# Patient Record
Sex: Male | Born: 1935 | Race: White | Hispanic: No | Marital: Married | Smoking: Former smoker
Health system: Southern US, Community
[De-identification: ages and names within clinical notes are randomized; demographics above are authoritative.]

## PROBLEM LIST (undated history)

## (undated) DIAGNOSIS — I69320 Aphasia following cerebral infarction: Secondary | ICD-10-CM

## (undated) DIAGNOSIS — I639 Cerebral infarction, unspecified: Secondary | ICD-10-CM

## (undated) DIAGNOSIS — G309 Alzheimer's disease, unspecified: Secondary | ICD-10-CM

## (undated) DIAGNOSIS — S43102A Unspecified dislocation of left acromioclavicular joint, initial encounter: Secondary | ICD-10-CM

## (undated) DIAGNOSIS — K4090 Unilateral inguinal hernia, without obstruction or gangrene, not specified as recurrent: Secondary | ICD-10-CM

## (undated) DIAGNOSIS — F320 Major depressive disorder, single episode, mild: Secondary | ICD-10-CM

## (undated) DIAGNOSIS — N529 Male erectile dysfunction, unspecified: Secondary | ICD-10-CM

## (undated) DIAGNOSIS — N401 Enlarged prostate with lower urinary tract symptoms: Secondary | ICD-10-CM

## (undated) DIAGNOSIS — G8191 Hemiplegia, unspecified affecting right dominant side: Secondary | ICD-10-CM

## (undated) DIAGNOSIS — I10 Essential (primary) hypertension: Secondary | ICD-10-CM

## (undated) DIAGNOSIS — F0280 Dementia in other diseases classified elsewhere without behavioral disturbance: Secondary | ICD-10-CM

## (undated) DIAGNOSIS — E785 Hyperlipidemia, unspecified: Secondary | ICD-10-CM

## (undated) DIAGNOSIS — M4800 Spinal stenosis, site unspecified: Secondary | ICD-10-CM

## (undated) DIAGNOSIS — N400 Enlarged prostate without lower urinary tract symptoms: Secondary | ICD-10-CM

## (undated) DIAGNOSIS — C189 Malignant neoplasm of colon, unspecified: Secondary | ICD-10-CM

## (undated) DIAGNOSIS — F32A Depression, unspecified: Secondary | ICD-10-CM

## (undated) HISTORY — DX: Benign prostatic hyperplasia with lower urinary tract symptoms: N40.1

## (undated) HISTORY — DX: Male erectile dysfunction, unspecified: N52.9

## (undated) HISTORY — DX: Depression, unspecified: F32.A

## (undated) HISTORY — DX: Major depressive disorder, single episode, mild: F32.0

## (undated) HISTORY — DX: Cerebral infarction, unspecified: I63.9

## (undated) HISTORY — PX: KIDNEY SURGERY: SHX687

## (undated) HISTORY — DX: Aphasia following cerebral infarction: I69.320

## (undated) HISTORY — PX: TONSILECTOMY, ADENOIDECTOMY, BILATERAL MYRINGOTOMY AND TUBES: SHX2538

## (undated) HISTORY — DX: Alzheimer's disease, unspecified: G30.9

## (undated) HISTORY — DX: Benign prostatic hyperplasia without lower urinary tract symptoms: N40.0

## (undated) HISTORY — DX: Dementia in other diseases classified elsewhere, unspecified severity, without behavioral disturbance, psychotic disturbance, mood disturbance, and anxiety: F02.80

## (undated) HISTORY — DX: Hemiplegia, unspecified affecting right dominant side: G81.91

## (undated) HISTORY — DX: Unilateral inguinal hernia, without obstruction or gangrene, not specified as recurrent: K40.90

## (undated) HISTORY — DX: Spinal stenosis, site unspecified: M48.00

## (undated) HISTORY — DX: Essential (primary) hypertension: I10

## (undated) HISTORY — DX: Unspecified dislocation of left acromioclavicular joint, initial encounter: S43.102A

## (undated) HISTORY — DX: Hyperlipidemia, unspecified: E78.5

---

## 1996-09-24 HISTORY — PX: HERNIA REPAIR: SHX51

## 1997-09-30 ENCOUNTER — Encounter: Admission: RE | Admit: 1997-09-30 | Discharge: 1997-12-29 | Payer: Self-pay | Admitting: *Deleted

## 1998-11-21 ENCOUNTER — Other Ambulatory Visit: Admission: RE | Admit: 1998-11-21 | Discharge: 1998-11-21 | Payer: Self-pay | Admitting: Urology

## 1999-07-08 ENCOUNTER — Encounter: Payer: Self-pay | Admitting: *Deleted

## 1999-07-08 ENCOUNTER — Encounter: Admission: RE | Admit: 1999-07-08 | Discharge: 1999-07-08 | Payer: Self-pay | Admitting: *Deleted

## 2000-03-09 ENCOUNTER — Ambulatory Visit (HOSPITAL_COMMUNITY): Admission: RE | Admit: 2000-03-09 | Discharge: 2000-03-09 | Payer: Self-pay | Admitting: Neurology

## 2000-03-27 ENCOUNTER — Emergency Department (HOSPITAL_COMMUNITY): Admission: EM | Admit: 2000-03-27 | Discharge: 2000-03-27 | Payer: Self-pay | Admitting: Emergency Medicine

## 2000-04-18 ENCOUNTER — Encounter: Admission: RE | Admit: 2000-04-18 | Discharge: 2000-04-18 | Payer: Self-pay | Admitting: *Deleted

## 2000-04-18 ENCOUNTER — Encounter: Payer: Self-pay | Admitting: *Deleted

## 2001-06-14 DIAGNOSIS — S43102A Unspecified dislocation of left acromioclavicular joint, initial encounter: Secondary | ICD-10-CM

## 2001-06-14 HISTORY — DX: Unspecified dislocation of left acromioclavicular joint, initial encounter: S43.102A

## 2001-06-14 HISTORY — PX: ORIF ACROMIOCLAVICULAR JOINT: SUR916

## 2002-06-09 ENCOUNTER — Encounter: Payer: Self-pay | Admitting: Orthopaedic Surgery

## 2002-06-09 ENCOUNTER — Emergency Department (HOSPITAL_COMMUNITY): Admission: EM | Admit: 2002-06-09 | Discharge: 2002-06-09 | Payer: Self-pay | Admitting: Emergency Medicine

## 2004-02-27 ENCOUNTER — Encounter: Admission: RE | Admit: 2004-02-27 | Discharge: 2004-02-27 | Payer: Self-pay | Admitting: Orthopaedic Surgery

## 2004-02-28 ENCOUNTER — Ambulatory Visit (HOSPITAL_COMMUNITY): Admission: RE | Admit: 2004-02-28 | Discharge: 2004-02-28 | Payer: Self-pay | Admitting: Orthopaedic Surgery

## 2004-02-28 ENCOUNTER — Ambulatory Visit (HOSPITAL_BASED_OUTPATIENT_CLINIC_OR_DEPARTMENT_OTHER): Admission: RE | Admit: 2004-02-28 | Discharge: 2004-02-28 | Payer: Self-pay | Admitting: Orthopaedic Surgery

## 2006-05-28 ENCOUNTER — Emergency Department (HOSPITAL_COMMUNITY): Admission: EM | Admit: 2006-05-28 | Discharge: 2006-05-28 | Payer: Self-pay | Admitting: Emergency Medicine

## 2006-05-31 ENCOUNTER — Encounter: Admission: RE | Admit: 2006-05-31 | Discharge: 2006-05-31 | Payer: Self-pay | Admitting: Orthopedic Surgery

## 2006-10-24 ENCOUNTER — Emergency Department (HOSPITAL_COMMUNITY): Admission: EM | Admit: 2006-10-24 | Discharge: 2006-10-24 | Payer: Self-pay | Admitting: Family Medicine

## 2008-06-14 DIAGNOSIS — I639 Cerebral infarction, unspecified: Secondary | ICD-10-CM

## 2008-06-14 HISTORY — DX: Cerebral infarction, unspecified: I63.9

## 2010-10-30 NOTE — Consult Note (Signed)
[place]  Patient:    Trevor Lutz, Trevor Lutz                       MRN: [id number] Proc. Date: 03/27/00 Adm. Date:  [id number] Attending:  Lorre Nick CC:         Vania Rea. Jarold Motto, M.D. [facility]  Genene Churn. Love, M.D.   Consultation Report  DATE OF BIRTH:  10-31-1921  REASON FOR CONSULTATION:  Painless rectal bleeding.  ASSESSMENT: 1. Rectal bleeding with history of external hemorrhoids. 2. History of hypertension, on Norvasc. 3. History of benign prostatic hypertrophy, on Flomax. 4. Memory loss recently, started on Reminyl. 5. Hypercholesterolemia, on Zocor. 6. 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 7. Recent colonoscopy that was essentially unrevealing (as per Dr. Sheryn Bison).  RECOMMENDATIONS: 1. Patient has been advised to stay in the hospital for 24 hour observation,    but he decided against it - he wanted to go home and to be seen by    Dr. Sheryn Bison in his office tomorrow morning.  Considering his    decision, the patient has been advised to call me or to return to the    emergency room if his symptoms worsen or if he has ongoing rectal    bleeding, dizziness, weakness, etc. 2. Discontinue aspirin and all other nonsteroidals for now. 3. Serial CBCs and further workup by Dr. Jarold Motto as advised.  DISCUSSION:  [name] is a 77 year old white male who was in his usual state of health until this morning, when he had four bowel movements with some bleeding.  Subsequently, the patient went to church and noted blood oozing through his clothes.  This prompted a visit to the emergency room, where he was seen and evaluated.  Labs were done, along with orthostatic vital signs, and GI consultation was procured.  Patient denied any history of dizziness, weakness, nausea, vomiting, fever, chills, rigors, abdominal pain, or melanic stools.  He denied straining or recent problem with constipation.  His  weight has been stable.  There is no history of chest pain, shortness of breath, or genitourinary complaints.  He denies any rectal burning or pain.  There is no history of nonsteroidal use besides the regular aspirin he takes.  While the patient was in the emergency room, I also spoke to Dr. Avie Echevaria, who suggests that his problems may have been set off by Reminyl the patient has been started on recently for memory loss.  This medication is known to cause diarrhea, which may have stimulated his rectal bleeding.  ALLERGIES:  No known drug allergies.  MEDICATIONS:  Zocor, Norvasc, aspirin, Flomax.  SOCIAL HISTORY:  The patient is a retired Pensions consultant, lives in [place] with his wife.  He drinks alcohol socially, but denies use of tobacco or street drugs.  FAMILY HISTORY:  Noncontributory.  GENERAL PHYSICAL EXAMINATION:  GENERAL:  Reveals an elderly white male lying on the ER stretcher in no acute distress.  VITAL SIGNS:  Blood pressure 120/80, pulse 54 per minute, respiratory 20 per minute, temperature 97 degrees Fahrenheit.  HEENT:  ______ PERRLA, face is symmetric.  NECK:  Supple, no JVD is heard.  No lymphadenopathy.  CHEST:  Clear to auscultation.  S1, regular, no murmur, rub, or gallop, rales, rhonchi, or wheezing.  ABDOMEN:  Soft, nondistended, with surgical scars present right of the midline from a previous nephrectomy.  No hepatosplenomegaly appreciated.  No  other masses palpable except for a small nodule in the left lower quadrant, which the patient says has been diagnosed as a lipoma in the past.  RECTAL:  Real small external hemorrhoids with fresh blood around them.  There is no evidence of thrombosis of the hemorrhoids.  A rectal examination reveals a significantly enlarged prostate with no definite nodularity appreciated. No other masses palpable.  There is fresh blood on examining finger.  No other masses palpable.  LABORATORY EVALUATION IN THE EMERGENCY  ROOM:  Revealed a hemoglobin of 14.5 gm/dcl, white count 5.5 , MCV 91.5, platelet count 136, with a normal differential.  Sodium 137, potassium 4.3, chloride 106, CO2 25, glucose 96, BUN 20, creatinine 1.1, calcium 8.9, total protein 6.1, albumin 3.5, AST 24, ALT 21, alkaline phosphatase 42, and total bili 0.6.  DISPOSITION:  Considering this data, the patient has been advised to follow up with Dr. Jarold Motto in the morning.  If he has recurrence of his symptoms, he is to return to the ER immediately.  Patients nurse, Trevor Lutz ______ , was at his bedside when these instructions were given to him, and has reemphasized these instructions herself. DD:  03/27/00 TD:  03/27/00 Job: [id number]

## 2010-10-30 NOTE — Op Note (Signed)
NAME:  Trevor Lutz, WEXLER                          ACCOUNT NO.:  [id number]   MEDICAL RECORD NO.:  [id number]                   PATIENT TYPE:  AMB   LOCATION:  DSC                                  FACILITY:  [facility]   PHYSICIAN:  Claude Manges. Cleophas Dunker, M.D.            DATE OF BIRTH:  11/30/1921   DATE OF PROCEDURE:  02/28/2004  DATE OF DISCHARGE:                                 OPERATIVE REPORT   PREOPERATIVE DIAGNOSIS:  Displaced right olecranon fracture.   POSTOPERATIVE DIAGNOSIS:  Displaced right olecranon fracture .   PROCEDURE:  Open reduction internal fixation.   SURGEON:  Claude Manges. Cleophas Dunker, M.D.   ASSISTANT:  Richardean Canal, P.A.   ANESTHESIA:  General LMA.   COMPLICATIONS:  None.   HISTORY:  An 77 year old gentleman who sustained a fall several days ago,  with persistent swelling and black and blue discoloration of the forearm,  associated with local tenderness about the elbow.  He was seen in the office  two days ago, with evidence of a olecranon fracture extending into the  joint, with about an eighth of an inch displacement.  He is now to have open  reduction and internal fixation.   DESCRIPTION OF PROCEDURE:  With the patient comfortable on the operating  room table, and under general anesthesia with the LMA, proximal tourniquet  was applied to the right upper extremity.  The right arm was then prepped  with Duraprep from the tips of the fingers to the tourniquet.  Sterile  draping was performed.  With the extremity still elevated, it was Esmarched  exsanguinated with a proximal tourniquet at 250 mmHg.   A longitudinal incision was made over the olecranon, extending from the tip  of the olecranon about 2-3 inches distally.  By sharp dissection, incision  went on through subcutaneous tissue, and then by further sharp dissection  the periosteum was then incised and then elevated radially and ulnarly.  The  fracture was identified just over an inch from the tip of the  olecranon.  It  extended obliquely from distal to proximal into the joint.  I could  visualize the fracture; it was reduced and maintained with a bone clamp, and  a K-wire placed across the fracture site.   I elected to use the AccuMed plate and screws, as it provides a low profile  construct with excellent fixation.  The titanium plate was then placed over  the tip of the olecranon and directly over the crest of the ulna distally.  Two screw holes were drilled, measured and filled with self-tapping cortical  screws distally -- with excellent purchase with both screws.  A third screw  was then placed just proximal to the fracture site, and in similar fashion a  drill measured and filled with the self-tapping cortical screw.  That screw  did cross the fracture site obliquely.  Image intensification revealed  excellent position of the fracture at  the joint space, and significant  decrease in the fracture itself.  Two further screws were inserted; one was  placed with a cancellous screw for the very tip of the plate at the  olecranon, and the other just distal to it a self-tapping cortical screw  into the proximal olecranon.   I had a very stable construct.  There was absolutely no motion of the  fracture site.  I did use small bone from the drill holes to place over the  fracture site.  As I placed the arm through a full range of motion there was  no evidence of impingement nor was there any motion.  The wound was  irrigated with saline solution.  The fascia was then sutured anatomically  over the plate with 2-0 and 3-0 Vicryl, with skin closed with skin clips.  Then 0.25% Marcaine without epinephrine was injected into the wound edges.  Sterile bulk dressing was applied, followed by posterior splint and a sling.   The patient tolerated the procedure without complications.   PLAN:  Recovery care.  Discharged on Vicodin.  To follow up at office in 5-7  days.      PWW/MEDQ  D:   02/28/2004  T:  02/29/2004  Job:  [id number]

## 2010-11-13 ENCOUNTER — Other Ambulatory Visit: Payer: Self-pay | Admitting: Neurology

## 2010-11-13 DIAGNOSIS — R413 Other amnesia: Secondary | ICD-10-CM

## 2010-11-17 ENCOUNTER — Ambulatory Visit
Admission: RE | Admit: 2010-11-17 | Discharge: 2010-11-17 | Disposition: A | Discharge status: Home / Self Care | Payer: Medicare PPO | Source: Ambulatory Visit | Attending: Neurology | Admitting: Neurology

## 2010-11-17 DIAGNOSIS — R413 Other amnesia: Secondary | ICD-10-CM

## 2012-09-26 DIAGNOSIS — K4090 Unilateral inguinal hernia, without obstruction or gangrene, not specified as recurrent: Secondary | ICD-10-CM | POA: Insufficient documentation

## 2012-09-26 HISTORY — DX: Unilateral inguinal hernia, without obstruction or gangrene, not specified as recurrent: K40.90

## 2012-10-03 ENCOUNTER — Encounter (INDEPENDENT_AMBULATORY_CARE_PROVIDER_SITE_OTHER): Payer: Self-pay | Admitting: General Surgery

## 2012-10-06 ENCOUNTER — Encounter (INDEPENDENT_AMBULATORY_CARE_PROVIDER_SITE_OTHER): Payer: Self-pay | Admitting: Surgery

## 2012-10-06 ENCOUNTER — Ambulatory Visit (INDEPENDENT_AMBULATORY_CARE_PROVIDER_SITE_OTHER): Payer: Medicare PPO | Admitting: Surgery

## 2012-10-06 VITALS — BP 114/78 | HR 60 | Temp 97.4°F | Resp 16 | Ht 68.5 in | Wt 160.8 lb

## 2012-10-06 DIAGNOSIS — K4090 Unilateral inguinal hernia, without obstruction or gangrene, not specified as recurrent: Secondary | ICD-10-CM

## 2012-10-06 NOTE — Patient Instructions (Signed)
Before we make a decision about repairing your hernia I will contact Dr Valentina Lucks.

## 2012-10-06 NOTE — Progress Notes (Signed)
Trevor Lutz DOB: 05/08/1922 MRN: [id number]                                                                                      DATE: 10/06/2012  PCP: Lillia Mountain, MD Referring Provider: No ref. provider found  IMPRESSION:  Reducible RIH, asymptomatic and likely indirect  PLAN:   I have discussed with the patient the fact that he has an inguinal hernia and reviewed the pathophysiology of that diagnosis. I have given him educational materials about this. I discussed options for treatment including observation or repair and have discussed both laparoscopic and open inguinal hernia repairs as potential techniques. We have discussed the use of mesh. We have discussed risks of surgery including bleeding, infection, recurrence, postoperative pain and possible chronic groin pain, testicular injury, and potential issues with voiding. I believe the patient's questions have been answered and that he has a good understanding of the issues  He is active given his age and would likely tolerate surgery well. The hernia is easily reducible and asx so a case could be made to defer repair. I discussed all this with him and will contact his PCP next week before making a decison                 CC:  Chief Complaint  Patient presents with  . Inguinal Hernia    new pt- eval RIH    HPI:  Trevor Lutz is a 79 y.o.  male who presents for evaluation of Right inguinal hernial he was referred to [place] by Dr. Clinton Sawyer for evaluation of a right inguinal hernia.although the patient doesn't recall, he had a large left indirect hernia repaired with mesh by Dr. Ovidio Kin in 1998. He has been aware of current hernia for about the last 8 weeks. It is asymptomatic and always reduce when he lies down. It has not gotten larger since he first noticed it.he plays tennis about 3 times per week. He is not having any other real symptoms.  PMH:  has a past medical history of Hypertension; BPH (benign prostatic  hyperplasia); Alzheimer disease; Hyperlipidemia; Erectile dysfunction; Mild depression; Spinal stenosis; and CVA (cerebral vascular accident).  PSH:   has past surgical history that includes Kidney surgery; Tonsilectomy, adenoidectomy, bilateral myringotomy and tubes; and Hernia repair (Left, 09/24/1996).  ALLERGIES:  No Known Allergies  MEDICATIONS: Current outpatient prescriptions:amLODipine (NORVASC) 5 MG tablet, Take 5 mg by mouth daily., Disp: , Rfl: ;  Ascorbic Acid (VITAMIN C PO), Take by mouth., Disp: , Rfl: ;  aspirin 325 MG tablet, Take 325 mg by mouth daily., Disp: , Rfl: ;  Cholecalciferol (VITAMIN D PO), Take 1,000 Units by mouth daily., Disp: , Rfl: ;  citalopram (CELEXA) 10 MG tablet, Take 10 mg by mouth daily., Disp: , Rfl:  divalproex (DEPAKOTE) 250 MG DR tablet, Take 250 mg by mouth 3 (three) times daily., Disp: , Rfl: ;  galantamine (RAZADYNE) 12 MG tablet, Take 12 mg by mouth 2 (two) times daily., Disp: , Rfl: ;  l-methylfolate-b2-b6-b12 (CEREFOLIN) 11-12-48-5 MG TABS, Take 1 tablet by mouth daily., Disp: , Rfl: ;  memantine (NAMENDA) 10  MG tablet, Take 10 mg by mouth 2 (two) times daily., Disp: , Rfl:  Multiple Vitamin (MULTIVITAMIN) tablet, Take 1 tablet by mouth daily., Disp: , Rfl: ;  simvastatin (ZOCOR) 40 MG tablet, Take 40 mg by mouth every evening., Disp: , Rfl: ;  tamsulosin (FLOMAX) 0.4 MG CAPS, Take by mouth., Disp: , Rfl: ;  vitamin B-12 (CYANOCOBALAMIN) 100 MCG tablet, Take 50 mcg by mouth daily., Disp: , Rfl:   ROS: He has filled out our 12 point review of systems and it is negative . EXAM:   VITAL SIGNS: BP 114/78  Pulse 60  Temp(Src) 97.4 F (36.3 C) (Oral)  Resp 16  Ht 5' 8.5" (1.74 m)  Wt 160 lb 12.8 oz (72.938 kg)  BMI 24.09 kg/m2  GENERAL:  The patient is alert, oriented, and generally healthy-appearing, NAD. Mood and affect are normal.  HEENT:  The head is normocephalic, the eyes nonicteric, the pupils were round regular and equal. EOMs are normal.  Pharynx normal. Dentition good.  NECK:  The neck is supple and there are no masses or thyromegaly.  LUNGS:  Normal respirations and clear to auscultation.  HEART:  Regular rhythm, with no murmurs rubs or gallops. Pulses are intact carotid dorsalis pedis and posterior tibial. No significant varicosities are noted.  ABDOMEN:  Soft, flat, and nontender. No masses or organomegaly is noted. No hernias are noted. Bowel sounds are normal.  GU:  Testes are normal bilaterally. There is no recurrent hernia present and the repair appeared solid. There is an obvious bulge in the right inguinal canal consistent with an indirect hernia. It reduces easily.  EXTREMITIES:  Good range of motion, no edema.   DATA REVIEWED:  I have reviewed the notes from the primary care physician's office as well as our old medical records.    Ayman Brull J 10/06/2012  CC: No ref. provider found, Lillia Mountain, MD

## 2012-10-10 ENCOUNTER — Telehealth (INDEPENDENT_AMBULATORY_CARE_PROVIDER_SITE_OTHER): Payer: Self-pay | Admitting: Surgery

## 2012-10-10 NOTE — Telephone Encounter (Signed)
I spoke with Dr Valentina Lucks who told me he thinks Trevor Lutz is medically stable to have surgery to repair his hernia. The patient does have some dementia and the hernia is not bothering  Him, so Dr Valentina Lucks felt we could either repair it or leave it for the time being. I spoke with Trevor Lutz, who told me he will discuss with his wife, who is out of town. We will try to contact them again in a few days to be sure the message gets through to his wife.

## 2012-11-03 ENCOUNTER — Emergency Department (HOSPITAL_COMMUNITY)
Admission: EM | Admit: 2012-11-03 | Discharge: 2012-11-03 | Disposition: A | Discharge status: Home / Self Care | Payer: Medicare PPO | Attending: Emergency Medicine | Admitting: Emergency Medicine

## 2012-11-03 ENCOUNTER — Encounter (HOSPITAL_COMMUNITY): Payer: Self-pay | Admitting: Emergency Medicine

## 2012-11-03 DIAGNOSIS — N529 Male erectile dysfunction, unspecified: Secondary | ICD-10-CM | POA: Insufficient documentation

## 2012-11-03 DIAGNOSIS — Z8673 Personal history of transient ischemic attack (TIA), and cerebral infarction without residual deficits: Secondary | ICD-10-CM | POA: Insufficient documentation

## 2012-11-03 DIAGNOSIS — I10 Essential (primary) hypertension: Secondary | ICD-10-CM | POA: Insufficient documentation

## 2012-11-03 DIAGNOSIS — Z79899 Other long term (current) drug therapy: Secondary | ICD-10-CM | POA: Insufficient documentation

## 2012-11-03 DIAGNOSIS — F3289 Other specified depressive episodes: Secondary | ICD-10-CM | POA: Insufficient documentation

## 2012-11-03 DIAGNOSIS — M4800 Spinal stenosis, site unspecified: Secondary | ICD-10-CM | POA: Insufficient documentation

## 2012-11-03 DIAGNOSIS — Z7982 Long term (current) use of aspirin: Secondary | ICD-10-CM | POA: Insufficient documentation

## 2012-11-03 DIAGNOSIS — G309 Alzheimer's disease, unspecified: Secondary | ICD-10-CM | POA: Insufficient documentation

## 2012-11-03 DIAGNOSIS — N400 Enlarged prostate without lower urinary tract symptoms: Secondary | ICD-10-CM | POA: Insufficient documentation

## 2012-11-03 DIAGNOSIS — K4090 Unilateral inguinal hernia, without obstruction or gangrene, not specified as recurrent: Secondary | ICD-10-CM | POA: Insufficient documentation

## 2012-11-03 DIAGNOSIS — E785 Hyperlipidemia, unspecified: Secondary | ICD-10-CM | POA: Insufficient documentation

## 2012-11-03 DIAGNOSIS — R339 Retention of urine, unspecified: Secondary | ICD-10-CM | POA: Insufficient documentation

## 2012-11-03 DIAGNOSIS — Z905 Acquired absence of kidney: Secondary | ICD-10-CM | POA: Insufficient documentation

## 2012-11-03 DIAGNOSIS — K645 Perianal venous thrombosis: Secondary | ICD-10-CM | POA: Insufficient documentation

## 2012-11-03 DIAGNOSIS — Z87891 Personal history of nicotine dependence: Secondary | ICD-10-CM | POA: Insufficient documentation

## 2012-11-03 DIAGNOSIS — F329 Major depressive disorder, single episode, unspecified: Secondary | ICD-10-CM | POA: Insufficient documentation

## 2012-11-03 DIAGNOSIS — F0280 Dementia in other diseases classified elsewhere without behavioral disturbance: Secondary | ICD-10-CM | POA: Insufficient documentation

## 2012-11-03 LAB — URINE MICROSCOPIC-ADD ON

## 2012-11-03 LAB — URINALYSIS, ROUTINE W REFLEX MICROSCOPIC
Leukocytes, UA: NEGATIVE
Nitrite: NEGATIVE
Specific Gravity, Urine: 1.022 (ref 1.005–1.030)
pH: 6 (ref 5.0–8.0)

## 2012-11-03 LAB — POCT I-STAT, CHEM 8
BUN: 21 mg/dL (ref 6–23)
Chloride: 107 mEq/L (ref 96–112)
Creatinine, Ser: 1.1 mg/dL (ref 0.50–1.35)
Glucose, Bld: 102 mg/dL — ABNORMAL HIGH (ref 70–99)
Potassium: 4.9 mEq/L (ref 3.5–5.1)

## 2012-11-03 NOTE — ED Notes (Signed)
PT ambulated in the hall with no ambulation assistance and no issues. Pt had steady gait.

## 2012-11-03 NOTE — ED Provider Notes (Signed)
History     CSN: [id number]  Arrival date & time 11/03/12  0544   First MD Initiated Contact with Patient 11/03/12 [phone]      Chief Complaint  Patient presents with  . Urinary Retention    (Consider location/radiation/quality/duration/timing/severity/associated sxs/prior treatment) HPI Comments: Patient presents with suprapubic pressure and inability to urinate since last night.  He has BPH and had a foley removed yesterday in Dr. Estil Daft office.  The foley was placed on 5/19 in [place].  Patient was seen in the ED for rectal bleeding and had 2 thrombosed hemorroids incised.  Denies any previous problems with urinary retention.  No fever, chills, nausea, vomiting, chest pain, SOB, back pain, abdominal pain.  No weaknes, numbness, tingling.  Foley placed before my evaluation with 800 cc of clear urine.  States rectal bleeding has improved.  States he feels at his baseline which caregivers confirm.  The history is provided by the patient, a relative and a caregiver. The history is limited by the condition of the patient.    Past Medical History  Diagnosis Date  . Hypertension   . BPH (benign prostatic hyperplasia)   . Alzheimer disease   . Hyperlipidemia   . Erectile dysfunction   . Mild depression   . Spinal stenosis   . CVA (cerebral vascular accident)     Past Surgical History  Procedure Laterality Date  . Kidney surgery      right kidney removed  . Tonsilectomy, adenoidectomy, bilateral myringotomy and tubes    . Hernia repair Left 09/24/1996    Indirect, Dr Ezzard Standing surgeon    Family History  Problem Relation Age of Onset  . Heart disease Father     History  Substance Use Topics  . Smoking status: Former Games developer  . Smokeless tobacco: Not on file  . Alcohol Use: Yes      Review of Systems  Constitutional: Negative for fever, activity change and appetite change.  HENT: Negative for congestion and rhinorrhea.   Respiratory: Negative for cough, chest  tightness and shortness of breath.   Cardiovascular: Negative for chest pain.  Gastrointestinal: Negative for nausea, vomiting and abdominal pain.  Genitourinary: Positive for dysuria and difficulty urinating.  Musculoskeletal: Negative for back pain.  Skin: Negative for rash.  Neurological: Negative for dizziness, weakness and headaches.  A complete 10 system review of systems was obtained and all systems are negative except as noted in the HPI and PMH.    Allergies  Review of patient's allergies indicates no known allergies.  Home Medications   Current Outpatient Rx  Name  Route  Sig  Dispense  Refill  . amLODipine (NORVASC) 5 MG tablet   Oral   Take 5 mg by mouth daily.         . Ascorbic Acid (VITAMIN C PO)   Oral   Take 1 tablet by mouth daily.          Marland Kitchen aspirin 325 MG tablet   Oral   Take 325 mg by mouth daily.         . Cholecalciferol (VITAMIN D PO)   Oral   Take 1,000 Units by mouth daily.         . citalopram (CELEXA) 10 MG tablet   Oral   Take 10 mg by mouth daily.         . divalproex (DEPAKOTE) 250 MG DR tablet   Oral   Take 250 mg by mouth 3 (three) times daily.         Marland Kitchen  galantamine (RAZADYNE) 12 MG tablet   Oral   Take 12 mg by mouth 2 (two) times daily.         Marland Kitchen l-methylfolate-b2-b6-b12 (CEREFOLIN) 11-12-48-5 MG TABS   Oral   Take 1 tablet by mouth daily.         . memantine (NAMENDA) 10 MG tablet   Oral   Take 10 mg by mouth 2 (two) times daily.         . Multiple Vitamin (MULTIVITAMIN) tablet   Oral   Take 1 tablet by mouth daily.         . simvastatin (ZOCOR) 40 MG tablet   Oral   Take 40 mg by mouth every evening.         . tamsulosin (FLOMAX) 0.4 MG CAPS   Oral   Take 0.4 mg by mouth 2 (two) times daily.          . vitamin B-12 (CYANOCOBALAMIN) 100 MCG tablet   Oral   Take 100 mcg by mouth daily.            BP 147/78  Pulse 69  Temp(Src) 97.5 F (36.4 C) (Oral)  Resp 18  SpO2 100%  Physical  Exam  Constitutional: He is oriented to person, place, and time. He appears well-developed and well-nourished. No distress.  HENT:  Head: Normocephalic and atraumatic.  Mouth/Throat: Oropharynx is clear and moist. No oropharyngeal exudate.  Eyes: Conjunctivae and EOM are normal. Pupils are equal, round, and reactive to light.  Neck: Normal range of motion. Neck supple.  Cardiovascular: Normal rate, regular rhythm and normal heart sounds.   No murmur heard. Pulmonary/Chest: Effort normal and breath sounds normal. No respiratory distress.  Abdominal: Soft. There is no tenderness. There is no rebound and no guarding.  Genitourinary:  Multiple small external hemorroids, non thrombosed Reducible R inguinal hernia  Musculoskeletal: Normal range of motion. He exhibits no edema and no tenderness.  Neurological: He is alert and oriented to person, place, and time. No cranial nerve deficit. He exhibits normal muscle tone. Coordination normal.  5/5 strength in bilateral lower extremities. Ankle plantar and dorsiflexion intact. Great toe extension intact bilaterally. +2 DP and PT pulses. +2 patellar reflexes bilaterally. Normal gait.     ED Course  Procedures (including critical care time)  Labs Reviewed  URINALYSIS, ROUTINE W REFLEX MICROSCOPIC - Abnormal; Notable for the following:    Hgb urine dipstick TRACE (*)    All other components within normal limits  POCT I-STAT, CHEM 8 - Abnormal; Notable for the following:    Glucose, Bld 102 (*)    Hemoglobin 12.9 (*)    HCT 38.0 (*)    All other components within normal limits  URINE MICROSCOPIC-ADD ON   No results found.   1. Urinary retention       MDM  Unable to urinate since last night.  Hx BPH, foley removed yesterday.  Feels better after Foley inserted. No abdominal pain, back pain, focal weakness, numbness, tingling.  Family states has "one kidney".  Foley placed by nursing staff with 800 cc of clear urine. Creatinine  normal. Abdomen soft and nontender. Neuro intact. Ambulatory.  Stable for followup with urology. He is already on flomax.       Glynn Octave, MD 11/03/12 [phone]

## 2012-11-03 NOTE — ED Notes (Signed)
Pt had two hemmroids lanced earlier this week.  Pt was having difficulty urinating so a foley was put into the pt at that time.  Pt had foley removed yesterday and at present is unable to void.

## 2012-11-09 ENCOUNTER — Other Ambulatory Visit: Payer: Self-pay | Admitting: Urology

## 2012-11-10 ENCOUNTER — Ambulatory Visit (HOSPITAL_COMMUNITY)
Admission: RE | Admit: 2012-11-10 | Discharge: 2012-11-10 | Disposition: A | Discharge status: Home / Self Care | Payer: Medicare PPO | Source: Ambulatory Visit | Attending: Urology | Admitting: Urology

## 2012-11-10 ENCOUNTER — Encounter (HOSPITAL_COMMUNITY): Payer: Self-pay

## 2012-11-10 ENCOUNTER — Encounter (HOSPITAL_COMMUNITY): Payer: Self-pay | Admitting: Pharmacy Technician

## 2012-11-10 ENCOUNTER — Encounter (HOSPITAL_COMMUNITY)
Admission: RE | Admit: 2012-11-10 | Discharge: 2012-11-10 | Disposition: A | Discharge status: Home / Self Care | Payer: Medicare PPO | Source: Ambulatory Visit | Attending: Urology | Admitting: Urology

## 2012-11-10 DIAGNOSIS — I10 Essential (primary) hypertension: Secondary | ICD-10-CM | POA: Insufficient documentation

## 2012-11-10 DIAGNOSIS — N400 Enlarged prostate without lower urinary tract symptoms: Secondary | ICD-10-CM | POA: Insufficient documentation

## 2012-11-10 DIAGNOSIS — Z01810 Encounter for preprocedural cardiovascular examination: Secondary | ICD-10-CM | POA: Insufficient documentation

## 2012-11-10 DIAGNOSIS — Z01818 Encounter for other preprocedural examination: Secondary | ICD-10-CM | POA: Insufficient documentation

## 2012-11-10 DIAGNOSIS — Z01812 Encounter for preprocedural laboratory examination: Secondary | ICD-10-CM | POA: Insufficient documentation

## 2012-11-10 LAB — SURGICAL PCR SCREEN
MRSA, PCR: NEGATIVE
Staphylococcus aureus: NEGATIVE

## 2012-11-10 LAB — CBC
HCT: 45.8 % (ref 39.0–52.0)
MCV: 93.5 fL (ref 78.0–100.0)
RBC: 4.9 MIL/uL (ref 4.22–5.81)
WBC: 6.7 10*3/uL (ref 4.0–10.5)

## 2012-11-10 LAB — BASIC METABOLIC PANEL
BUN: 15 mg/dL (ref 6–23)
CO2: 30 mEq/L (ref 19–32)
Chloride: 103 mEq/L (ref 96–112)
Creatinine, Ser: 1.1 mg/dL (ref 0.50–1.35)

## 2012-11-10 NOTE — Patient Instructions (Addendum)
YOUR SURGERY IS SCHEDULED AT [facility]  ON:  Monday  6/2  REPORT TO [facility] SHORT STAY [facility] AT:  5:30 AM      PHONE # FOR SHORT STAY IS [phone]  IF YOU HAVE A HEALTH CARE POWER OF ATTORNEY AND / OR LIVING WILL - PLEASE BRING DAY OF SURGERY.  STOP TAKING DAILY ASPIRIN FOR SURGERY.  DO NOT EAT OR DRINK ANYTHING AFTER MIDNIGHT THE NIGHT BEFORE YOUR SURGERY.  YOU MAY BRUSH YOUR TEETH, RINSE OUT YOUR MOUTH--BUT NO WATER, NO FOOD, NO CHEWING GUM, NO MINTS, NO CANDIES, NO CHEWING TOBACCO.  PLEASE TAKE THE FOLLOWING MEDICATIONS THE AM OF YOUR SURGERY WITH A FEW SIPS OF WATER: AMLODIPINE, CITALOPRAM, DIVALPROEX, GALANTAMINE, NAMENDA, TAMSULOSIN       DO NOT BRING VALUABLES, MONEY, CREDIT CARDS.  DO NOT WEAR JEWELRY, MAKE-UP, NAIL POLISH AND NO METAL PINS OR CLIPS IN YOUR HAIR. CONTACT LENS, DENTURES / PARTIALS, GLASSES SHOULD NOT BE WORN TO SURGERY AND IN MOST CASES-HEARING AIDS WILL NEED TO BE REMOVED.  BRING YOUR GLASSES CASE, ANY EQUIPMENT NEEDED FOR YOUR CONTACT LENS. FOR PATIENTS ADMITTED TO THE HOSPITAL--CHECK OUT TIME THE DAY OF DISCHARGE IS 11:00 AM.  ALL INPATIENT ROOMS ARE PRIVATE - WITH BATHROOM, TELEPHONE, TELEVISION AND WIFI INTERNET.                               PLEASE READ OVER ANY  FACT SHEETS THAT YOU WERE GIVEN: MRSA INFORMATION, BLOOD TRANSFUSION INFORMATION, INCENTIVE SPIROMETER INFORMATION. FAILURE TO FOLLOW THESE INSTRUCTIONS MAY RESULT IN THE CANCELLATION OF YOUR SURGERY.   PATIENT SIGNATURE_________________________________

## 2012-11-10 NOTE — Pre-Procedure Instructions (Addendum)
PT VOICED UNDERSTANDING THAT HE WAS HAVING PROSTATE SURGERY AND SIGNED OR CONSENT.  PT THEN LATER ASKED WHAT HIS SURGERY WAS -STATES HE DOESN'T REMEMBER.  PT HAS AN OFFICE NOTE ON HIS CHART FROM DR. LOVE-NEUROLOGIST - DATED 02/02/12 DETAILING PT'S MEDICAL HX AND MEMORY PROBLEMS.  PT CONTINUED THRU OUT PREOP VISIT TO ASK ABOUT HIS SURGERY.  HIS FRIEND BARRY MAYBERRY WITH HIM FOR PREOP VISIT AND INSTRUCTIONS AND STATES HE WILL MAKE SURE PT'S WIFE REVIEWS ALL PREOP INSTRUCTIONS.  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  I HAVE REQUESTED PT TO BRING - IF HE HAS.  I HAVE PLACED A NOTE ON PT'S CHART FOR SHORT STAY NURSE DAY OF SURGERY TO PLEASE HAVE PT'S WIFE SIGN THE CONSENT FOR SURGERY - SHE WILL BE WITH HIM DAY OF SURGERY.  I VOIDED THE CONSENT THE PT SIGNED. EKG AND CXR WERE DONE TODAY PREOP - AT [facility]. SPOKE WITH PAM GIBSON AT DR. Estil Daft OFFICE - SHE STATES PT WAS TO STOP ASPIRIN FOR SURGERY - I HAVE PUT ON HIS WRITTEN PREOP INSTRUCTIONS NOT TO TAKE ANY MORE ASPIRIN BEFORE SURGERY.

## 2012-11-11 NOTE — H&P (Signed)
History of Present Illness                          F/u BPH and urinary symptoms. Pt has been on tamsulosin for many years. H/o elevated PSA as high as 6.01 in 2006 but down to 4.7 in 2009.  -May 2014 - normal DRE - he wasn't certain he was taking tamsulosin.    Interval Hx He had some issues with retention and had a Foley placed. He was visiting [place]. He had an equivocal voiding trial here. Foley was replaced at ER draining 800 ml. He is now on tamsulosin b.i.d. His urine is clear yellow. He is with his caregiver.  We discussed the nature risks and benefits of cystoscopy and a voiding trial and they wish to proceed. His brother-in-law is a Careers adviser and they were all discussed and they are weary of putting the patient under anesthesia for any procedures.   Past Medical History Problems  1. History of  Hypercholesterolemia 272.0 2. History of  Hypertension 401.9 3. History of  Memory Lapses Or Loss 780.93  Surgical History Problems  1. History of  Nephrectomy Right V45.73 2. History of  Tonsillectomy  Current Meds 1. Aspirin 325 MG Oral Tablet; Therapy: (Recorded:03Aug2010) to 2. Cerefolin TABS; Therapy: (Recorded:03Aug2010) to 3. Hyoscyamine Sulfate 0.125 MG Sublingual Tablet Sublingual; PLACE 1 TABLET UNDER THE  TONGUE EVERY 3 TO 4 HOURS AS NEEDED FOR BLADDER SPASMS; Therapy: 21May2014 to  (Evaluate:22May2014)  Requested for: 21May2014; Last Rx:21May2014 4. Norvasc 5 MG Oral Tablet; Therapy: (Recorded:03Aug2010) to 5. Oxybutynin Chloride ER 5 MG Oral Tablet Extended Release 24 Hour; TAKE 1 TABLET 3 times  daily PRN spasms; Therapy: 20May2014 to (Evaluate:09Jun2014)  Requested for: 20May2014;  Last Rx:20May2014 6. Razadyne 12 MG Oral Tablet; Therapy: (Recorded:03Aug2010) to 7. Tamsulosin HCl 0.4 MG Oral Capsule; TAKE 1 CAPSULE EVERY DAY; Therapy: 11Nov2010 to  (Evaluate:06Aug2014)  Requested for: 08May2014; Last Rx:08May2014 8. Zocor 40 MG Oral Tablet; Therapy:  (Recorded:03Aug2010) to  Allergies Medication  1. No Known Drug Allergies  Family History Problems  1. Family history of  Prostate Cancer V16.42  Social History Problems  1. Alcohol Use wine 2. Marital History - Currently Married 3. Occupation: retired Clinical research associate Denied  4. History of  Tobacco Use  Vitals Vital Signs [Data Includes: Last 1 Day]  27May2014 03:16PM  Blood Pressure: 126 / 74 Temperature: 97.5 F Heart Rate: 55  Procedure  Procedure: Cystoscopy   Indication: Lower Urinary Tract Symptoms.  Informed Consent: Risks, benefits, and potential adverse events were discussed and informed consent was obtained from the patient.  Prep: The patient was prepped with betadine.  Procedure Note:  Urethral meatus:. No abnormalities.  Anterior urethra: No abnormalities.  Prostatic urethra: No abnormalities . The lateral and median prostatic lobes were enlarged. An enlarged intravesical median lobe was visualized.  Bladder: Visulization was clear. The ureteral orifices were in the normal anatomic position bilaterally and had clear efflux of urine. A systematic survey of the bladder demonstrated no bladder tumors or stones. The mucosa was smooth without abnormalities. The patient tolerated the procedure well.     Filled 200 ml. He voided with a dribbling flow and straining.  Complications: None.    Assessment Assessed  1. Benign Prostatic Hypertrophy With Urinary Obstruction 600.01 2. Acute Urinary Retention 788.20  Plan Acute Urinary Retention (788.20)  1. Cath, simple, wIinsert Temp Cath  Done: 27May2014 2. Cysto  Done: 27May2014 Benign Prostatic Hypertrophy With Urinary  Obstruction (600.01)  3. Finasteride 5 MG Oral Tablet; Take 1 tablet every day; Therapy: 27May2014 to  (Evaluate:22May2015)  Requested for: 27May2014; Last Rx:27May2014; Edited 4. URODYNAMICS  Requested for: 27May2014 5. Follow-up After Test Office  Follow-up  Requested for:  27May2014  Discussion/Summary  Discussed options - foley replacement or CIC. Foley was replaced. We could consider greenlight or TURP in the long run  - both would be feasible. He has a small median lobe, therefore TUMT not the best option. I discussed the nature risks and benefits of 5 alpha reductase inhibitors including at the warnings on sexual dysfunction and higher grade prostate cancer among others. They elected to begin finasteride. Will get UDS to determine if he truly has bladder with obstruction and if a procedure in the OR is worth the risks.  His relative is Dr. Lovenia Kim, a urologist in [facility], so I will give him a call.   cc; Dr. Kirby Funk      Signatures Electronically signed by : Jerilee Field, M.D.; Nov 07 2012  4:16PM  I spoke with Dr. Shellee Milo on several occasions and he has communicated with the family. I also discussed the nature risks benefits and alternatives to Mrs. Villwock and all in agreement to proceed with TURP.

## 2012-11-12 NOTE — Anesthesia Preprocedure Evaluation (Addendum)
Anesthesia Evaluation  Patient identified by MRN, date of birth, ID band Patient awake  General Assessment Comment:1. History of  Hypercholesterolemia 272.0 2. History of  Hypertension 401.9 3. History of  Memory Lapses Or Loss 780.93     Reviewed: Allergy & Precautions, H&P , NPO status , Patient's Chart, lab work & pertinent test results  Airway Mallampati: II TM Distance: >3 FB Neck ROM: Full    Dental no notable dental hx.    Pulmonary neg pulmonary ROS, former smoker,  CXR: No acute cardiopulmonary disease. breath sounds clear to auscultation  Pulmonary exam normal       Cardiovascular Exercise Tolerance: Good hypertension, Pt. on medications negative cardio ROS  Rhythm:Regular Rate:Normal  ECG: SB 51, otherwise normal.   Neuro/Psych PSYCHIATRIC DISORDERS Depression Alzheimers disease.  CVA. Mild depression. CVA negative neurological ROS     GI/Hepatic negative GI ROS, Neg liver ROS, (+)     substance abuse  alcohol use,   Endo/Other  negative endocrine ROS  Renal/GU H/O right nephrectomy.  negative genitourinary   Musculoskeletal negative musculoskeletal ROS (+)   Abdominal   Peds negative pediatric ROS (+)  Hematology negative hematology ROS (+)   Anesthesia Other Findings   Reproductive/Obstetrics negative OB ROS                         Anesthesia Physical Anesthesia Plan  ASA: III  Anesthesia Plan: General   Post-op Pain Management:    Induction: Intravenous  Airway Management Planned: LMA  Additional Equipment:   Intra-op Plan:   Post-operative Plan: Extubation in OR  Informed Consent: I have reviewed the patients History and Physical, chart, labs and discussed the procedure including the risks, benefits and alternatives for the proposed anesthesia with the patient or authorized representative who has indicated his/her understanding and acceptance.   Dental  advisory given  Plan Discussed with: CRNA  Anesthesia Plan Comments: (Discussed general versus spinal with patient and patient's wife. They prefer general. Discussed risk of possible postoperative confusion. They voice understanding.)     Anesthesia Quick Evaluation

## 2012-11-13 ENCOUNTER — Encounter (HOSPITAL_COMMUNITY)
Admission: RE | Disposition: A | Discharge status: Home / Self Care | Payer: Self-pay | Source: Ambulatory Visit | Attending: Urology

## 2012-11-13 ENCOUNTER — Encounter (HOSPITAL_COMMUNITY): Payer: Self-pay | Admitting: Anesthesiology

## 2012-11-13 ENCOUNTER — Ambulatory Visit (HOSPITAL_COMMUNITY): Payer: Medicare PPO | Admitting: Anesthesiology

## 2012-11-13 ENCOUNTER — Ambulatory Visit (HOSPITAL_COMMUNITY)
Admission: RE | Admit: 2012-11-13 | Discharge: 2012-11-14 | Disposition: A | Discharge status: Home / Self Care | Payer: Medicare PPO | Source: Ambulatory Visit | Attending: Urology | Admitting: Urology

## 2012-11-13 ENCOUNTER — Encounter (HOSPITAL_COMMUNITY): Payer: Self-pay | Admitting: *Deleted

## 2012-11-13 DIAGNOSIS — Z8042 Family history of malignant neoplasm of prostate: Secondary | ICD-10-CM | POA: Insufficient documentation

## 2012-11-13 DIAGNOSIS — R339 Retention of urine, unspecified: Secondary | ICD-10-CM | POA: Insufficient documentation

## 2012-11-13 DIAGNOSIS — N3289 Other specified disorders of bladder: Secondary | ICD-10-CM | POA: Insufficient documentation

## 2012-11-13 DIAGNOSIS — Z8673 Personal history of transient ischemic attack (TIA), and cerebral infarction without residual deficits: Secondary | ICD-10-CM | POA: Insufficient documentation

## 2012-11-13 DIAGNOSIS — E7800 Pure hypercholesterolemia, unspecified: Secondary | ICD-10-CM | POA: Insufficient documentation

## 2012-11-13 DIAGNOSIS — N138 Other obstructive and reflux uropathy: Secondary | ICD-10-CM | POA: Insufficient documentation

## 2012-11-13 DIAGNOSIS — R413 Other amnesia: Secondary | ICD-10-CM | POA: Insufficient documentation

## 2012-11-13 DIAGNOSIS — Z7982 Long term (current) use of aspirin: Secondary | ICD-10-CM | POA: Insufficient documentation

## 2012-11-13 DIAGNOSIS — N401 Enlarged prostate with lower urinary tract symptoms: Secondary | ICD-10-CM | POA: Insufficient documentation

## 2012-11-13 DIAGNOSIS — I10 Essential (primary) hypertension: Secondary | ICD-10-CM | POA: Insufficient documentation

## 2012-11-13 DIAGNOSIS — Z79899 Other long term (current) drug therapy: Secondary | ICD-10-CM | POA: Insufficient documentation

## 2012-11-13 HISTORY — PX: TRANSURETHRAL RESECTION OF PROSTATE: SHX73

## 2012-11-13 SURGERY — TURP (TRANSURETHRAL RESECTION OF PROSTATE)
Anesthesia: General | Site: Prostate | Wound class: Clean Contaminated

## 2012-11-13 MED ORDER — ONE-DAILY MULTI VITAMINS PO TABS: 1.0000 | ORAL_TABLET | Freq: Every day | ORAL | Status: DC

## 2012-11-13 MED ORDER — BACITRACIN-NEOMYCIN-POLYMYXIN 400-5-5000 EX OINT: 1.0000 "application " | TOPICAL_OINTMENT | Freq: Three times a day (TID) | CUTANEOUS | Status: DC | PRN

## 2012-11-13 MED ORDER — DIPHENHYDRAMINE HCL 50 MG/ML IJ SOLN: 12.5000 mg | Freq: Four times a day (QID) | INTRAMUSCULAR | Status: DC | PRN

## 2012-11-13 MED ORDER — PROPOFOL 10 MG/ML IV BOLUS
INTRAVENOUS | Status: DC | PRN
  Administered 2012-11-13: 120 mg via INTRAVENOUS
  Administered 2012-11-13: 20 mg via INTRAVENOUS

## 2012-11-13 MED ORDER — FENTANYL CITRATE 0.05 MG/ML IJ SOLN: 25.0000 ug | INTRAMUSCULAR | Status: DC | PRN

## 2012-11-13 MED ORDER — SODIUM CHLORIDE 0.9 % IV SOLN
INTRAVENOUS | Status: DC
  Administered 2012-11-13 – 2012-11-14 (×2): via INTRAVENOUS

## 2012-11-13 MED ORDER — ONDANSETRON HCL 4 MG/2ML IJ SOLN
INTRAMUSCULAR | Status: DC | PRN
  Administered 2012-11-13: 4 mg via INTRAVENOUS

## 2012-11-13 MED ORDER — DOCUSATE SODIUM 100 MG PO CAPS
100.0000 mg | ORAL_CAPSULE | Freq: Two times a day (BID) | ORAL | Status: DC
  Administered 2012-11-13 – 2012-11-14 (×2): 100 mg via ORAL
  Filled 2012-11-13 (×3): qty 1

## 2012-11-13 MED ORDER — LIDOCAINE HCL (CARDIAC) 20 MG/ML IV SOLN
INTRAVENOUS | Status: DC | PRN
  Administered 2012-11-13: 50 mg via INTRAVENOUS

## 2012-11-13 MED ORDER — CEFAZOLIN SODIUM-DEXTROSE 2-3 GM-% IV SOLR
2.0000 g | INTRAVENOUS | Status: AC
  Administered 2012-11-13: 2 g via INTRAVENOUS

## 2012-11-13 MED ORDER — MEMANTINE HCL 10 MG PO TABS
10.0000 mg | ORAL_TABLET | Freq: Two times a day (BID) | ORAL | Status: DC
  Administered 2012-11-13 – 2012-11-14 (×2): 10 mg via ORAL
  Filled 2012-11-13 (×3): qty 1

## 2012-11-13 MED ORDER — DIPHENHYDRAMINE HCL 12.5 MG/5ML PO ELIX: 12.5000 mg | ORAL_SOLUTION | Freq: Four times a day (QID) | ORAL | Status: DC | PRN

## 2012-11-13 MED ORDER — LACTATED RINGERS IV SOLN: INTRAVENOUS | Status: DC

## 2012-11-13 MED ORDER — ADULT MULTIVITAMIN W/MINERALS CH
1.0000 | ORAL_TABLET | Freq: Every day | ORAL | Status: DC
  Administered 2012-11-13 – 2012-11-14 (×2): 1 via ORAL
  Filled 2012-11-13 (×2): qty 1

## 2012-11-13 MED ORDER — AMLODIPINE BESYLATE 5 MG PO TABS
5.0000 mg | ORAL_TABLET | Freq: Every day | ORAL | Status: DC
  Administered 2012-11-14: 5 mg via ORAL
  Filled 2012-11-13: qty 1

## 2012-11-13 MED ORDER — BELLADONNA ALKALOIDS-OPIUM 16.2-60 MG RE SUPP
RECTAL | Status: AC
  Filled 2012-11-13: qty 1

## 2012-11-13 MED ORDER — ZOLPIDEM TARTRATE 5 MG PO TABS
5.0000 mg | ORAL_TABLET | Freq: Every evening | ORAL | Status: DC | PRN
  Administered 2012-11-13: 5 mg via ORAL
  Filled 2012-11-13: qty 1

## 2012-11-13 MED ORDER — VITAMIN D3 25 MCG (1000 UNIT) PO TABS
1000.0000 [IU] | ORAL_TABLET | Freq: Every day | ORAL | Status: DC
Start: 2012-11-13 — End: 2012-11-14
  Administered 2012-11-13 – 2012-11-14 (×2): 1000 [IU] via ORAL
  Filled 2012-11-13 (×2): qty 1

## 2012-11-13 MED ORDER — HYDROCODONE-ACETAMINOPHEN 5-325 MG PO TABS
1.0000 | ORAL_TABLET | ORAL | Status: DC | PRN
  Administered 2012-11-14 (×2): 1 via ORAL
  Filled 2012-11-13 (×2): qty 1

## 2012-11-13 MED ORDER — TAMSULOSIN HCL 0.4 MG PO CAPS
0.4000 mg | ORAL_CAPSULE | Freq: Every day | ORAL | Status: DC
  Administered 2012-11-14: 0.4 mg via ORAL
  Filled 2012-11-13: qty 1

## 2012-11-13 MED ORDER — CEFAZOLIN SODIUM-DEXTROSE 2-3 GM-% IV SOLR
INTRAVENOUS | Status: AC
  Filled 2012-11-13: qty 50

## 2012-11-13 MED ORDER — BELLADONNA ALKALOIDS-OPIUM 16.2-60 MG RE SUPP
1.0000 | Freq: Once | RECTAL | Status: AC
  Administered 2012-11-13: 1 via RECTAL

## 2012-11-13 MED ORDER — ATORVASTATIN CALCIUM 20 MG PO TABS
20.0000 mg | ORAL_TABLET | Freq: Every day | ORAL | Status: DC
  Administered 2012-11-13: 20 mg via ORAL
  Filled 2012-11-13 (×2): qty 1

## 2012-11-13 MED ORDER — SIMVASTATIN 40 MG PO TABS
40.0000 mg | ORAL_TABLET | Freq: Every day | ORAL | Status: DC
  Filled 2012-11-13: qty 1

## 2012-11-13 MED ORDER — L-METHYLFOLATE-B12-B6-B2 6-1-50-5 MG PO TABS: 1.0000 | ORAL_TABLET | Freq: Every day | ORAL | Status: DC

## 2012-11-13 MED ORDER — SODIUM CHLORIDE 0.9 % IR SOLN
Status: DC | PRN
  Administered 2012-11-13: 24000 mL

## 2012-11-13 MED ORDER — ONDANSETRON HCL 4 MG/2ML IJ SOLN: 4.0000 mg | INTRAMUSCULAR | Status: DC | PRN

## 2012-11-13 MED ORDER — DIVALPROEX SODIUM 250 MG PO DR TAB
250.0000 mg | DELAYED_RELEASE_TABLET | Freq: Two times a day (BID) | ORAL | Status: DC
  Administered 2012-11-13 – 2012-11-14 (×2): 250 mg via ORAL
  Filled 2012-11-13 (×3): qty 1

## 2012-11-13 MED ORDER — L-METHYLFOLATE-B6-B12 3-35-2 MG PO TABS
2.0000 | ORAL_TABLET | Freq: Every day | ORAL | Status: DC
  Administered 2012-11-13 – 2012-11-14 (×2): 2 via ORAL
  Filled 2012-11-13 (×2): qty 2

## 2012-11-13 MED ORDER — LACTATED RINGERS IV SOLN
INTRAVENOUS | Status: DC | PRN
  Administered 2012-11-13: 07:00:00 via INTRAVENOUS

## 2012-11-13 MED ORDER — VITAMIN B-12 100 MCG PO TABS
100.0000 ug | ORAL_TABLET | Freq: Every day | ORAL | Status: DC
  Administered 2012-11-13 – 2012-11-14 (×2): 100 ug via ORAL
  Filled 2012-11-13 (×2): qty 1

## 2012-11-13 MED ORDER — ACETAMINOPHEN 325 MG PO TABS: 650.0000 mg | ORAL_TABLET | ORAL | Status: DC | PRN

## 2012-11-13 MED ORDER — HYOSCYAMINE SULFATE 0.125 MG SL SUBL
0.1250 mg | SUBLINGUAL_TABLET | SUBLINGUAL | Status: DC | PRN
  Administered 2012-11-14: 0.125 mg via ORAL
  Filled 2012-11-13: qty 1

## 2012-11-13 MED ORDER — GALANTAMINE HYDROBROMIDE 4 MG PO TABS
12.0000 mg | ORAL_TABLET | Freq: Two times a day (BID) | ORAL | Status: DC
  Administered 2012-11-13 – 2012-11-14 (×2): 12 mg via ORAL
  Filled 2012-11-13 (×3): qty 3

## 2012-11-13 MED ORDER — FENTANYL CITRATE 0.05 MG/ML IJ SOLN
25.0000 ug | INTRAMUSCULAR | Status: DC | PRN
  Administered 2012-11-13 (×4): 25 ug via INTRAVENOUS

## 2012-11-13 MED ORDER — FENTANYL CITRATE 0.05 MG/ML IJ SOLN
INTRAMUSCULAR | Status: DC | PRN
Start: 2012-11-13 — End: 2012-11-13
  Administered 2012-11-13 (×4): 25 ug via INTRAVENOUS

## 2012-11-13 MED ORDER — CITALOPRAM HYDROBROMIDE 10 MG PO TABS
10.0000 mg | ORAL_TABLET | Freq: Every day | ORAL | Status: DC
  Administered 2012-11-14: 10 mg via ORAL
  Filled 2012-11-13: qty 1

## 2012-11-13 MED ORDER — CEFAZOLIN SODIUM 1-5 GM-% IV SOLN
1.0000 g | Freq: Three times a day (TID) | INTRAVENOUS | Status: AC
  Administered 2012-11-13 – 2012-11-14 (×2): 1 g via INTRAVENOUS
  Filled 2012-11-13 (×3): qty 50

## 2012-11-13 MED ORDER — EPHEDRINE SULFATE 50 MG/ML IJ SOLN
INTRAMUSCULAR | Status: DC | PRN
  Administered 2012-11-13: 5 mg via INTRAVENOUS
  Administered 2012-11-13: 10 mg via INTRAVENOUS

## 2012-11-13 MED ORDER — VITAMIN C 500 MG PO TABS
500.0000 mg | ORAL_TABLET | Freq: Every day | ORAL | Status: DC
  Administered 2012-11-13 – 2012-11-14 (×2): 500 mg via ORAL
  Filled 2012-11-13 (×2): qty 1

## 2012-11-13 MED ORDER — FENTANYL CITRATE 0.05 MG/ML IJ SOLN
INTRAMUSCULAR | Status: AC
  Filled 2012-11-13: qty 2

## 2012-11-13 SURGICAL SUPPLY — 20 items
BAG URINE DRAINAGE (UROLOGICAL SUPPLIES) ×2 IMPLANT
BAG URO CATCHER STRL LF (DRAPE) ×2 IMPLANT
CATH FOLEY 3WAY 30CC 22FR (CATHETERS) ×1 IMPLANT
CLOTH BEACON ORANGE TIMEOUT ST (SAFETY) ×2 IMPLANT
DRAPE CAMERA CLOSED 9X96 (DRAPES) ×2 IMPLANT
ELECT LOOP MED HF 24F 12D CBL (CLIP) ×1 IMPLANT
ELECT REM PT RETURN 9FT ADLT (ELECTROSURGICAL) ×2
ELECTRODE REM PT RTRN 9FT ADLT (ELECTROSURGICAL) ×1 IMPLANT
GLOVE BIOGEL M STRL SZ7.5 (GLOVE) ×2 IMPLANT
GLOVE SURG SS PI 6.5 STRL IVOR (GLOVE) ×1 IMPLANT
GOWN STRL REIN XL XLG (GOWN DISPOSABLE) ×3 IMPLANT
HOLDER FOLEY CATH W/STRAP (MISCELLANEOUS) ×1 IMPLANT
IV NS IRRIG 3000ML ARTHROMATIC (IV SOLUTION) ×8 IMPLANT
MANIFOLD NEPTUNE II (INSTRUMENTS) ×2 IMPLANT
PACK CYSTO (CUSTOM PROCEDURE TRAY) ×2 IMPLANT
SYR 30ML LL (SYRINGE) ×1 IMPLANT
SYRINGE 10CC LL (SYRINGE) ×1 IMPLANT
SYRINGE IRR TOOMEY STRL 70CC (SYRINGE) ×1 IMPLANT
TUBING CONNECTING 10 (TUBING) ×2 IMPLANT
WATER STERILE IRR 500ML POUR (IV SOLUTION) ×1 IMPLANT

## 2012-11-13 NOTE — Op Note (Signed)
Preoperative diagnosis: BPH, urinary retention Postop diagnosis: BPH and urinary retention  Procedure:  Exam under anesthesia  I Transurethral resection of prostate, bipolar  Surgeon: Mena Goes   Type of anesthesia: GEN   Findings:  Exam under anesthesia - the testicles were descended and without mass bilaterally. The penis was normal uncircumcised without lesions. On digital rectal exam the prostate was enlarged but smooth without hard areas or nodules.  On cystoscopy the urethra appeared normal. The prostatic urethra was elongated and visually obstructing with trilobar hypertrophy. The trigone and ureteral orifices were normal and in their normal orthotopic position with clear reflux. They were effluxed normally pre-and post resection the bladder had moderate trabeculation but contained no stone or foreign body. There were no apparent mucosal lesions although visualization was obscured by prostatic hematuria.  Description of procedure: After consent was obtained patient brought to the operating room. After adequate anesthesia he was placed in lithotomy position and prepped and draped in the usual fashion. A timeout was performed to confirm the patient and procedure. An exam under anesthesia was performed with sterile gloves. After a glove change cystoscope was passed per urethra but the prostate was very friable with some bleeding vessels therefore the cystoscope was exchanged for the resectoscope sheath passed with the visual obturator. The loop was used to resect the median lobe starting at 5:00 and 7:00 making a groove down to the bladder neck taking it down to the veru bilaterally. The median lobe was then resected down to this level from the bladder neck in that area. Next the right lateral lobe was resected from bladder neck. Superior to inferior. In a similar fashion the left lateral lobe was resected. Hemostasis was good. The chips were evacuated and some residual obstructing tissue was  noted along the median lobe and lateral lobes and this was carefully resected. These chips were evacuated and hemostasis was good. Inspection noted there to be a channel and no further chips in the bladder. Ureteral orifices appeared normal with good efflux. Great care was taken not to involve the ureteral orifice, the bladder neck or the urethral sphincter during the resection. Resection was brought down just to the veru.   A 22 French three-way catheter was placed with catheter guide the balloon inflated with 30 cc and seated at the bladder neck. CBI was instituted in the urine light pink to clear. The patient was awakened taken to room in stable condition.   Complications: None   Blood loss: Minimal   Specimens: TURP chips   Drains: 22 French three-way Foley   Disposition: Patient stable to PACU.

## 2012-11-13 NOTE — Anesthesia Postprocedure Evaluation (Signed)
  Anesthesia Post-op Note  Patient: Trevor Lutz  Procedure(s) Performed: Procedure(s) (LRB): BI POLAR TRANSURETHRAL RESECTION OF THE PROSTATE (TURP) (N/A)  Patient Location: PACU  Anesthesia Type: General  Level of Consciousness: awake and alert   Airway and Oxygen Therapy: Patient Spontanous Breathing  Post-op Pain: mild  Post-op Assessment: Post-op Vital signs reviewed, Patient's Cardiovascular Status Stable, Respiratory Function Stable, Patent Airway and No signs of Nausea or vomiting  Last Vitals:  Filed Vitals:   11/13/12 1030  BP: 140/73  Pulse: 55  Temp: 36.7 C  Resp: 15    Post-op Vital Signs: stable   Complications: No apparent anesthesia complications

## 2012-11-13 NOTE — Preoperative (Signed)
Beta Blockers   Reason not to administer Beta Blockers:Not Applicable 

## 2012-11-13 NOTE — Interval H&P Note (Signed)
History and Physical Interval Note:  11/13/2012 7:27 AM  Trevor Lutz  has presented today for surgery, with the diagnosis of BPH ACUTE URINARY RETENTION   The various methods of treatment have been discussed with the patient and family. After consideration of risks, benefits and other options for treatment, the patient has consented to  Procedure(s): BI POLAR TRANSURETHRAL RESECTION OF THE PROSTATE (TURP) (N/A) as a surgical intervention .  The patient's history has been reviewed, patient examined, no change in status, stable for surgery.  I have reviewed the patient's chart and labs. Dr. Valentina Lucks reviewed patient and cleared for surgery. Questions were answered to the patient's and wife's satisfaction.     [name]

## 2012-11-13 NOTE — Progress Notes (Signed)
Patient ID: Trevor Lutz, male   DOB: 06/02/1922, 79 y.o.   MRN: [id number]   Pt resting comfortably.   NAD Alert, oriented to person (stable from pre-op) Abd - soft, NT, bladder not distended GU - foley on slow CBI - urine light pink Ext - no edema  Imp -post-op TURP  Plan- -cont CBI - void trial in AM

## 2012-11-13 NOTE — Transfer of Care (Signed)
Immediate Anesthesia Transfer of Care Note  Patient: Trevor Lutz  Procedure(s) Performed: Procedure(s): BI POLAR TRANSURETHRAL RESECTION OF THE PROSTATE (TURP) (N/A)  Patient Location: PACU  Anesthesia Type:General  Level of Consciousness: awake, alert , oriented and patient cooperative  Airway & Oxygen Therapy: Patient Spontanous Breathing and Patient connected to face mask oxygen  Post-op Assessment: Report given to PACU RN and Post -op Vital signs reviewed and stable  Post vital signs: Reviewed and stable  Complications: No apparent anesthesia complications

## 2012-11-14 ENCOUNTER — Encounter (HOSPITAL_COMMUNITY): Payer: Self-pay | Admitting: Urology

## 2012-11-14 MED ORDER — ASPIRIN 325 MG PO TABS: 325.0000 mg | ORAL_TABLET | Freq: Every day | ORAL | Status: DC

## 2012-11-14 MED ORDER — CEPHALEXIN 250 MG PO CAPS: 250.0000 mg | ORAL_CAPSULE | Freq: Every day | ORAL | Status: DC

## 2012-11-14 MED ORDER — HYOSCYAMINE SULFATE 0.125 MG SL SUBL: 0.1250 mg | SUBLINGUAL_TABLET | SUBLINGUAL | Status: DC | PRN

## 2012-11-14 NOTE — Progress Notes (Signed)
Patient ID: Trevor Lutz, male   DOB: 09-11-1921, 79 y.o.   MRN: [id number]  Patient unable to void.  76fr Coude foley placed  With return of 600cc of dark but clot free urine.  He has bladder spasms with pain once the bladder was empty.    Discharge per Dr. Mena Goes.

## 2012-11-14 NOTE — Progress Notes (Signed)
Patient ID: Trevor Lutz, male   DOB: [birth year], 79 y.o.   MRN: [id number]  Foley out at 0500.   Pt stood to void a couple of times but has not been able. He's not in any pain, but does feel like he needs to void and cannot.   Plan - Check bladder scanner Discussed with Mrs. Mormino Discussed with Dr. Patsi Sears May need d/c home with foley.

## 2012-11-14 NOTE — Progress Notes (Signed)
Foley was discontinued at 0500. Patient tolerated well. RN instructed patient to drink lots of water and fluids this morning to help with the voiding trial. Patient agreed to drinking water.

## 2012-12-28 ENCOUNTER — Telehealth: Payer: Self-pay | Admitting: Neurology

## 2013-01-01 NOTE — Telephone Encounter (Signed)
Spoke to patient. Former Dr. Sandria Manly patient. Requesting to schedule an appt. Says he is doing fine, just needs appt.

## 2013-01-04 ENCOUNTER — Telehealth: Payer: Self-pay | Admitting: Diagnostic Neuroimaging

## 2013-09-21 ENCOUNTER — Telehealth: Payer: Self-pay | Admitting: Neurology

## 2013-09-21 NOTE — Telephone Encounter (Signed)
Sent to [facility] for reassignment

## 2013-09-21 NOTE — Telephone Encounter (Signed)
Trevor Lutz w/[employer] called states she works for pt's wife and wants [place] to call and get pt reassigned to a Dr. Abbott Pao is a transfer of care Dr. Erling Cruz. Pt is up at night not sleeping and has a dementia along with other things going on. Please call Melissa concerning the pt per pt's wife. Thanks

## 2013-09-25 NOTE — Telephone Encounter (Signed)
Reassigned to Dr. Krista Blue.

## 2013-10-03 NOTE — Telephone Encounter (Signed)
Patient is scheduled for 10-22-13 with Dr. Krista Blue.

## 2013-10-20 ENCOUNTER — Encounter (HOSPITAL_COMMUNITY): Payer: Self-pay | Admitting: Emergency Medicine

## 2013-10-20 ENCOUNTER — Emergency Department (HOSPITAL_COMMUNITY): Payer: Medicare PPO

## 2013-10-20 ENCOUNTER — Inpatient Hospital Stay (HOSPITAL_COMMUNITY)
Admission: EM | Admit: 2013-10-20 | Discharge: 2013-10-25 | DRG: 041 | Disposition: A | Discharge status: Skilled Nursing Facility | Payer: Medicare PPO | Attending: Internal Medicine | Admitting: Internal Medicine

## 2013-10-20 ENCOUNTER — Inpatient Hospital Stay (HOSPITAL_COMMUNITY): Payer: Medicare PPO

## 2013-10-20 DIAGNOSIS — I10 Essential (primary) hypertension: Secondary | ICD-10-CM

## 2013-10-20 DIAGNOSIS — N529 Male erectile dysfunction, unspecified: Secondary | ICD-10-CM | POA: Diagnosis present

## 2013-10-20 DIAGNOSIS — K4090 Unilateral inguinal hernia, without obstruction or gangrene, not specified as recurrent: Secondary | ICD-10-CM

## 2013-10-20 DIAGNOSIS — Z8673 Personal history of transient ischemic attack (TIA), and cerebral infarction without residual deficits: Secondary | ICD-10-CM

## 2013-10-20 DIAGNOSIS — IMO0002 Reserved for concepts with insufficient information to code with codable children: Secondary | ICD-10-CM | POA: Diagnosis not present

## 2013-10-20 DIAGNOSIS — Z79899 Other long term (current) drug therapy: Secondary | ICD-10-CM

## 2013-10-20 DIAGNOSIS — Z87891 Personal history of nicotine dependence: Secondary | ICD-10-CM

## 2013-10-20 DIAGNOSIS — F0280 Dementia in other diseases classified elsewhere without behavioral disturbance: Secondary | ICD-10-CM | POA: Diagnosis present

## 2013-10-20 DIAGNOSIS — I6340 Cerebral infarction due to embolism of unspecified cerebral artery: Principal | ICD-10-CM | POA: Diagnosis present

## 2013-10-20 DIAGNOSIS — G309 Alzheimer's disease, unspecified: Secondary | ICD-10-CM | POA: Diagnosis present

## 2013-10-20 DIAGNOSIS — R278 Other lack of coordination: Secondary | ICD-10-CM

## 2013-10-20 DIAGNOSIS — Z7902 Long term (current) use of antithrombotics/antiplatelets: Secondary | ICD-10-CM

## 2013-10-20 DIAGNOSIS — R4701 Aphasia: Secondary | ICD-10-CM | POA: Diagnosis present

## 2013-10-20 DIAGNOSIS — I639 Cerebral infarction, unspecified: Secondary | ICD-10-CM | POA: Diagnosis present

## 2013-10-20 DIAGNOSIS — R279 Unspecified lack of coordination: Secondary | ICD-10-CM | POA: Diagnosis present

## 2013-10-20 DIAGNOSIS — N401 Enlarged prostate with lower urinary tract symptoms: Secondary | ICD-10-CM | POA: Diagnosis present

## 2013-10-20 DIAGNOSIS — I6350 Cerebral infarction due to unspecified occlusion or stenosis of unspecified cerebral artery: Secondary | ICD-10-CM

## 2013-10-20 DIAGNOSIS — F0390 Unspecified dementia without behavioral disturbance: Secondary | ICD-10-CM

## 2013-10-20 DIAGNOSIS — G8190 Hemiplegia, unspecified affecting unspecified side: Secondary | ICD-10-CM | POA: Diagnosis present

## 2013-10-20 DIAGNOSIS — R339 Retention of urine, unspecified: Secondary | ICD-10-CM | POA: Diagnosis not present

## 2013-10-20 DIAGNOSIS — Z66 Do not resuscitate: Secondary | ICD-10-CM | POA: Diagnosis not present

## 2013-10-20 DIAGNOSIS — N138 Other obstructive and reflux uropathy: Secondary | ICD-10-CM | POA: Diagnosis present

## 2013-10-20 DIAGNOSIS — Z8249 Family history of ischemic heart disease and other diseases of the circulatory system: Secondary | ICD-10-CM

## 2013-10-20 DIAGNOSIS — I498 Other specified cardiac arrhythmias: Secondary | ICD-10-CM | POA: Diagnosis present

## 2013-10-20 DIAGNOSIS — N400 Enlarged prostate without lower urinary tract symptoms: Secondary | ICD-10-CM | POA: Diagnosis present

## 2013-10-20 DIAGNOSIS — E785 Hyperlipidemia, unspecified: Secondary | ICD-10-CM | POA: Diagnosis present

## 2013-10-20 LAB — CBC
HEMATOCRIT: 43.7 % (ref 39.0–52.0)
HEMOGLOBIN: 14.7 g/dL (ref 13.0–17.0)
MCH: 32 pg (ref 26.0–34.0)
MCHC: 33.6 g/dL (ref 30.0–36.0)
MCV: 95.2 fL (ref 78.0–100.0)
Platelets: 126 10*3/uL — ABNORMAL LOW (ref 150–400)
RBC: 4.59 MIL/uL (ref 4.22–5.81)
RDW: 14.4 % (ref 11.5–15.5)
WBC: 5.1 10*3/uL (ref 4.0–10.5)

## 2013-10-20 LAB — APTT: APTT: 28 s (ref 24–37)

## 2013-10-20 LAB — DIFFERENTIAL
BASOS ABS: 0 10*3/uL (ref 0.0–0.1)
BASOS PCT: 0 % (ref 0–1)
EOS PCT: 6 % — AB (ref 0–5)
Eosinophils Absolute: 0.3 10*3/uL (ref 0.0–0.7)
LYMPHS PCT: 34 % (ref 12–46)
Lymphs Abs: 1.7 10*3/uL (ref 0.7–4.0)
MONO ABS: 0.7 10*3/uL (ref 0.1–1.0)
MONOS PCT: 14 % — AB (ref 3–12)
NEUTROS ABS: 2.4 10*3/uL (ref 1.7–7.7)
Neutrophils Relative %: 46 % (ref 43–77)

## 2013-10-20 LAB — I-STAT CHEM 8, ED
BUN: 20 mg/dL (ref 6–23)
CALCIUM ION: 1.14 mmol/L (ref 1.13–1.30)
Chloride: 101 mEq/L (ref 96–112)
Creatinine, Ser: 1.3 mg/dL (ref 0.50–1.35)
Glucose, Bld: 103 mg/dL — ABNORMAL HIGH (ref 70–99)
HCT: 45 % (ref 39.0–52.0)
HEMOGLOBIN: 15.3 g/dL (ref 13.0–17.0)
Potassium: 4.5 mEq/L (ref 3.7–5.3)
SODIUM: 141 meq/L (ref 137–147)
TCO2: 26 mmol/L (ref 0–100)

## 2013-10-20 LAB — ETHANOL

## 2013-10-20 LAB — URINALYSIS, ROUTINE W REFLEX MICROSCOPIC
Bilirubin Urine: NEGATIVE
Glucose, UA: NEGATIVE mg/dL
HGB URINE DIPSTICK: NEGATIVE
Ketones, ur: NEGATIVE mg/dL
Leukocytes, UA: NEGATIVE
NITRITE: NEGATIVE
PROTEIN: NEGATIVE mg/dL
SPECIFIC GRAVITY, URINE: 1.014 (ref 1.005–1.030)
UROBILINOGEN UA: 0.2 mg/dL (ref 0.0–1.0)
pH: 7 (ref 5.0–8.0)

## 2013-10-20 LAB — PROTIME-INR
INR: 1.03 (ref 0.00–1.49)
Prothrombin Time: 13.3 seconds (ref 11.6–15.2)

## 2013-10-20 LAB — GLUCOSE, CAPILLARY: Glucose-Capillary: 153 mg/dL — ABNORMAL HIGH (ref 70–99)

## 2013-10-20 LAB — COMPREHENSIVE METABOLIC PANEL
ALT: 19 U/L (ref 0–53)
AST: 31 U/L (ref 0–37)
Albumin: 3.2 g/dL — ABNORMAL LOW (ref 3.5–5.2)
Alkaline Phosphatase: 51 U/L (ref 39–117)
BILIRUBIN TOTAL: 0.3 mg/dL (ref 0.3–1.2)
BUN: 19 mg/dL (ref 6–23)
CALCIUM: 8.4 mg/dL (ref 8.4–10.5)
CHLORIDE: 102 meq/L (ref 96–112)
CO2: 25 meq/L (ref 19–32)
CREATININE: 1.05 mg/dL (ref 0.50–1.35)
GFR calc Af Amer: 70 mL/min — ABNORMAL LOW (ref >90)
GFR, EST NON AFRICAN AMERICAN: 61 mL/min — AB (ref >90)
Glucose, Bld: 98 mg/dL (ref 70–99)
Potassium: 5 mEq/L (ref 3.7–5.3)
Sodium: 139 mEq/L (ref 137–147)
Total Protein: 6.3 g/dL (ref 6.0–8.3)

## 2013-10-20 LAB — I-STAT TROPONIN, ED: TROPONIN I, POC: 0.01 ng/mL (ref 0.00–0.08)

## 2013-10-20 LAB — RAPID URINE DRUG SCREEN, HOSP PERFORMED
Amphetamines: NOT DETECTED
BENZODIAZEPINES: NOT DETECTED
Barbiturates: NOT DETECTED
COCAINE: NOT DETECTED
OPIATES: NOT DETECTED
Tetrahydrocannabinol: NOT DETECTED

## 2013-10-20 MED ORDER — TAMSULOSIN HCL 0.4 MG PO CAPS
0.4000 mg | ORAL_CAPSULE | Freq: Every day | ORAL | Status: DC
  Administered 2013-10-20 – 2013-10-24 (×5): 0.4 mg via ORAL
  Filled 2013-10-20 (×6): qty 1

## 2013-10-20 MED ORDER — CLOPIDOGREL BISULFATE 75 MG PO TABS
75.0000 mg | ORAL_TABLET | Freq: Every day | ORAL | Status: DC
  Administered 2013-10-21 – 2013-10-25 (×5): 75 mg via ORAL
  Filled 2013-10-20 (×6): qty 1

## 2013-10-20 MED ORDER — CITALOPRAM HYDROBROMIDE 10 MG PO TABS
10.0000 mg | ORAL_TABLET | Freq: Every day | ORAL | Status: DC
  Administered 2013-10-22 – 2013-10-25 (×4): 10 mg via ORAL
  Filled 2013-10-20 (×5): qty 1

## 2013-10-20 MED ORDER — GALANTAMINE HYDROBROMIDE 4 MG PO TABS
12.0000 mg | ORAL_TABLET | Freq: Two times a day (BID) | ORAL | Status: DC
  Administered 2013-10-21 – 2013-10-25 (×10): 12 mg via ORAL
  Filled 2013-10-20 (×12): qty 3

## 2013-10-20 MED ORDER — HEPARIN SODIUM (PORCINE) 5000 UNIT/ML IJ SOLN
5000.0000 [IU] | Freq: Three times a day (TID) | INTRAMUSCULAR | Status: DC
  Administered 2013-10-20 – 2013-10-25 (×14): 5000 [IU] via SUBCUTANEOUS
  Filled 2013-10-20 (×17): qty 1

## 2013-10-20 MED ORDER — GALANTAMINE HYDROBROMIDE 4 MG PO TABS: 12.0000 mg | ORAL_TABLET | Freq: Two times a day (BID) | ORAL | Status: DC

## 2013-10-20 MED ORDER — DIVALPROEX SODIUM 250 MG PO DR TAB
250.0000 mg | DELAYED_RELEASE_TABLET | Freq: Two times a day (BID) | ORAL | Status: DC
  Administered 2013-10-20 – 2013-10-25 (×9): 250 mg via ORAL
  Filled 2013-10-20 (×12): qty 1

## 2013-10-20 MED ORDER — MEMANTINE HCL 10 MG PO TABS
10.0000 mg | ORAL_TABLET | Freq: Two times a day (BID) | ORAL | Status: DC
  Administered 2013-10-20 – 2013-10-25 (×9): 10 mg via ORAL
  Filled 2013-10-20 (×12): qty 1

## 2013-10-20 MED ORDER — INSULIN ASPART 100 UNIT/ML ~~LOC~~ SOLN
0.0000 [IU] | Freq: Three times a day (TID) | SUBCUTANEOUS | Status: DC
  Administered 2013-10-24: 1 [IU] via SUBCUTANEOUS

## 2013-10-20 MED ORDER — SIMVASTATIN 40 MG PO TABS
40.0000 mg | ORAL_TABLET | Freq: Every evening | ORAL | Status: DC
  Administered 2013-10-20 – 2013-10-24 (×5): 40 mg via ORAL
  Filled 2013-10-20 (×6): qty 1

## 2013-10-20 MED ORDER — SENNOSIDES-DOCUSATE SODIUM 8.6-50 MG PO TABS: 1.0000 | ORAL_TABLET | Freq: Every evening | ORAL | Status: DC | PRN

## 2013-10-20 MED ORDER — VITAMIN B-12 100 MCG PO TABS
100.0000 ug | ORAL_TABLET | Freq: Every day | ORAL | Status: DC
  Administered 2013-10-22 – 2013-10-25 (×4): 100 ug via ORAL
  Filled 2013-10-20 (×5): qty 1

## 2013-10-20 NOTE — H&P (Signed)
[facility] History and Physical  Trevor Lutz GBT:[id number] DOB: [birth year] DOA: 10/20/2013  Referring physician: Dr. Blanchie Dessert PCP: Irven Shelling, MD   Chief Complaint: Right side weakness  HPI: Trevor Lutz is a 80 y.o. male  Past medical history of dementia,  Hypertension and a stroke takes aspirin at home, that came in with a right-sided weakness started around 2 PM today. As per caregivers he was slightly more confused and was not able to handle things on his right hand. Survey called EMS and he was brought here to the ED. in the ED his symptoms slightly improved his NIHSS was 1.  In the ED: A CT scan of the head was done that was negative for bleed. Basic metabolic panel CBC has been no   Review of Systems:  Constitutional:  No weight loss, night sweats, Fevers, chills, fatigue.  HEENT:  No headaches, Difficulty swallowing,Tooth/dental problems,Sore throat,  No sneezing, itching, ear ache, nasal congestion, post nasal drip,  Cardio-vascular:  No chest pain, Orthopnea, PND, swelling in lower extremities, anasarca, dizziness, palpitations  GI:  No heartburn, indigestion, abdominal pain, nausea, vomiting, diarrhea, change in bowel habits, loss of appetite  Resp:  No shortness of breath with exertion or at rest. No excess mucus, no productive cough, No non-productive cough, No coughing up of blood.No change in color of mucus.No wheezing.No chest wall deformity  Skin:  no rash or lesions.  GU:  no dysuria, change in color of urine, no urgency or frequency. No flank pain.  Musculoskeletal:  No joint pain or swelling. No decreased range of motion. No back pain.   Past Medical History  Diagnosis Date  . Hypertension   . BPH (benign prostatic hyperplasia)     HAS INDWELLING FOLEY CATHETER  . Alzheimer disease   . Hyperlipidemia   . Erectile dysfunction   . Mild depression   . Spinal stenosis   . CVA (cerebral vascular accident)    Past Surgical History    Procedure Laterality Date  . Kidney surgery      right kidney removed  . Tonsilectomy, adenoidectomy, bilateral myringotomy and tubes    . Hernia repair Left 09/24/1996    Indirect, Dr Lucia Gaskins surgeon  . Transurethral resection of prostate N/A 11/13/2012    Procedure: BI POLAR TRANSURETHRAL RESECTION OF THE PROSTATE (TURP);  Surgeon: Fredricka Bonine, MD;  Location: [facility];  Service: Urology;  Laterality: N/A;   Social History:  reports that he has quit smoking. His smoking use included Cigarettes. He smoked 0.00 packs per day. He does not have any smokeless tobacco history on file. He reports that he drinks alcohol. He reports that he does not use illicit drugs.  No Known Allergies  Family History  Problem Relation Age of Onset  . Heart disease Father   . Heart disease Mother      Prior to Admission medications   Medication Sig Start Date End Date Taking? Authorizing Provider  amLODipine (NORVASC) 5 MG tablet Take 5 mg by mouth daily before breakfast.    Yes Historical Provider, MD  aspirin 325 MG tablet Take 1 tablet (325 mg total) by mouth daily. 11/16/12  Yes Fredricka Bonine, MD  cholecalciferol (VITAMIN D) 1000 UNITS tablet Take 1,000 Units by mouth daily.   Yes Historical Provider, MD  citalopram (CELEXA) 10 MG tablet Take 10 mg by mouth daily.   Yes Historical Provider, MD  diphenhydramine-acetaminophen (TYLENOL PM) 25-500 MG TABS Take 1 tablet by mouth at bedtime as  needed.   Yes Historical Provider, MD  divalproex (DEPAKOTE) 250 MG DR tablet Take 250 mg by mouth 2 (two) times daily.    Yes Historical Provider, MD  galantamine (RAZADYNE) 12 MG tablet Take 12 mg by mouth 2 (two) times daily.   Yes Historical Provider, MD  l-methylfolate-b2-b6-b12 (CEREFOLIN) 11-12-48-5 MG TABS Take 1 tablet by mouth daily.   Yes Historical Provider, MD  memantine (NAMENDA) 10 MG tablet Take 10 mg by mouth 2 (two) times daily.   Yes Historical Provider, MD  Multiple Vitamin  (MULTIVITAMIN) tablet Take 1 tablet by mouth daily.   Yes Historical Provider, MD  simvastatin (ZOCOR) 40 MG tablet Take 40 mg by mouth every evening.   Yes Historical Provider, MD  tamsulosin (FLOMAX) 0.4 MG CAPS Take 0.4 mg by mouth daily.    Yes Historical Provider, MD  vitamin B-12 (CYANOCOBALAMIN) 100 MCG tablet Take 100 mcg by mouth daily.   Yes Historical Provider, MD  vitamin C (ASCORBIC ACID) 500 MG tablet Take 500 mg by mouth daily.   Yes Historical Provider, MD  cephALEXin (KEFLEX) 250 MG capsule Take 1 capsule (250 mg total) by mouth daily. 11/14/12   Dewayne Hatch. Odella Aquas   Physical Exam: Filed Vitals:   10/20/13 1642  BP: 152/72  Pulse:   Temp:   Resp: 18    BP 152/72  Pulse 59  Temp(Src) 98.1 F (36.7 C) (Oral)  Resp 18  Ht 5\' 7"  (1.702 m)  Wt 74.702 kg (164 lb 11 oz)  BMI 25.79 kg/m2  SpO2 96%  General:  Appears calm and comfortable, awake alert and oriented x2 Eyes: PERRL, normal lids, irises & conjunctiva ENT: grossly normal hearing, lips & tongue Neck: no LAD, masses or thyromegaly Cardiovascular: RRR, no m/r/g. No LE edema. Telemetry: SR, no arrhythmias  Respiratory: CTA bilaterally, no w/r/r. Normal respiratory effort. Abdomen: soft, ntnd Skin: no rash or induration seen on limited exam Musculoskeletal: grossly normal tone BUE/BLE Psychiatric: grossly normal mood and affect, speech fluent and appropriate Neurologic: 12 are grossly intact sensation is intact throughout muscle strength 5 out 5 in all 4 extremities except the right upper extremity which is about 4/5. Deep tendon reflexes are 2+ bilaterally and symmetric Babinski's negative.           Labs on Admission:  Basic Metabolic Panel:  Recent Labs Lab 10/20/13 1447 10/20/13 1456  NA 139 141  K 5.0 4.5  CL 102 101  CO2 25  --   GLUCOSE 98 103*  BUN 19 20  CREATININE 1.05 1.30  CALCIUM 8.4  --    Liver Function Tests:  Recent Labs Lab 10/20/13 1447  AST 31  ALT 19  ALKPHOS 51   BILITOT 0.3  PROT 6.3  ALBUMIN 3.2*   No results found for this basename: LIPASE, AMYLASE,  in the last 168 hours No results found for this basename: AMMONIA,  in the last 168 hours CBC:  Recent Labs Lab 10/20/13 1447 10/20/13 1456  WBC 5.1  --   NEUTROABS 2.4  --   HGB 14.7 15.3  HCT 43.7 45.0  MCV 95.2  --   PLT 126*  --    Cardiac Enzymes: No results found for this basename: CKTOTAL, CKMB, CKMBINDEX, TROPONINI,  in the last 168 hours  BNP (last 3 results) No results found for this basename: PROBNP,  in the last 8760 hours CBG: No results found for this basename: GLUCAP,  in the last 168 hours  Radiological Exams  on Admission: Ct Head Wo Contrast  10/20/2013   CLINICAL DATA:  Code stroke, right arm weakness  EXAM: CT HEAD WITHOUT CONTRAST  TECHNIQUE: Contiguous axial images were obtained from the base of the skull through the vertex without intravenous contrast.  COMPARISON:  MR 11/17/2010  FINDINGS: Retention cyst or polyp in the right frontal sinus. Atherosclerotic and physiologic intracranial calcifications. Large lacunar infarct in the left basal ganglia and corona radiata with adjacent hypoattenuation in the adjacent white matter and some mild ex vacuo dilatation of the adjacent lateral ventricle. There is a medial left occipital lobe subacute or chronic infarct which was not conspicuous on the prior study. No acute intracranial hemorrhage, midline shift, mass, or mass effect. Moderate diffuse parenchymal atrophy. Mild patchy areas of hypoattenuation in deep and periventricular white matter bilaterally.  IMPRESSION: 1. Negative for bleed or other acute intracranial process. 2. Old left basal ganglia/corona radiata and left occipital infarcts. 3. Atrophy and nonspecific white matter changes. Critical Value/emergent results were called by telephone at the time of interpretation on 10/20/2013 at 2:49 PM to Dr. Doy Mince, who verbally acknowledged these results.   Electronically Signed    By: Arne Cleveland M.D.   On: 10/20/2013 14:49    EKG: Independently reviewed. Sinus rhythm normal axis no T wave abnormalities.  Assessment/Plan  CVA (cerebral infarction) - HgbA1c, fasting lipid panel  - MRI, MRA of the brain without contrast  - PT consult, OT consult, Speech consult  - Echocardiogram  - Carotid dopplers  - Prophylactic therapy-Antiplatelet med: Plavix  - Avoid D5 fluids as may be harmfull - Risk factor modification  - Cardiac Monitoring  - Neurochecks q4h  - Keep MAP 70   Essential hypertension - Hold antihypertensive.  Dementia - cont home medications.   Code Status: full Family Communication: caregiver Disposition Plan: inpatient  Time spent: 20 minutes  [facility] Pager [phone]

## 2013-10-20 NOTE — Consult Note (Signed)
Referring Physician: Sabra Heck    Chief Complaint: Right sided weakness  HPI: Trevor Lutz is an 80 y.o. male who was with his caregiver today.  She noted acute onset difficulty with speech and right upper extremity weakness.  When symptoms did not improve EMS was called and the patient was brought in as a code stroke.  Initial NIHSS of 1.   Patient with history of stroke in the past.  Date last known well: Date: 10/20/2013 Time last known well: Time: 13:35 tPA Given: No: Improvement in symptoms, low NIHSS  Past Medical History  Diagnosis Date  . Hypertension   . BPH (benign prostatic hyperplasia)     HAS INDWELLING FOLEY CATHETER  . Alzheimer disease   . Hyperlipidemia   . Erectile dysfunction   . Mild depression   . Spinal stenosis   . CVA (cerebral vascular accident)     Past Surgical History  Procedure Laterality Date  . Kidney surgery      right kidney removed  . Tonsilectomy, adenoidectomy, bilateral myringotomy and tubes    . Hernia repair Left 09/24/1996    Indirect, Dr Lucia Gaskins surgeon  . Transurethral resection of prostate N/A 11/13/2012    Procedure: BI POLAR TRANSURETHRAL RESECTION OF THE PROSTATE (TURP);  Surgeon: Fredricka Bonine, MD;  Location: [facility];  Service: Urology;  Laterality: N/A;    Family History  Problem Relation Age of Onset  . Heart disease Father    Social History:  reports that he has quit smoking. His smoking use included Cigarettes. He smoked 0.00 packs per day. He does not have any smokeless tobacco history on file. He reports that he drinks alcohol. He reports that he does not use illicit drugs.  Allergies: No Known Allergies  Medications: I have reviewed the patient's current medications. Prior to Admission:  Current outpatient prescriptions: amLODipine (NORVASC) 5 MG tablet, Take 5 mg by mouth daily before breakfast. , Disp: , Rfl: ;   aspirin 325 MG tablet, Take 1 tablet (325 mg total) by mouth daily., Disp: , Rfl: ;   cephALEXin  (KEFLEX) 250 MG capsule, Take 1 capsule (250 mg total) by mouth daily., Disp: 10 capsule, Rfl: 0;   cholecalciferol (VITAMIN D) 1000 UNITS tablet, Take 1,000 Units by mouth daily., Disp: , Rfl:  citalopram (CELEXA) 10 MG tablet, Take 10 mg by mouth daily., Disp: , Rfl: ;   divalproex (DEPAKOTE) 250 MG DR tablet, Take 250 mg by mouth 2 (two) times daily. , Disp: , Rfl: ;   galantamine (RAZADYNE) 12 MG tablet, Take 12 mg by mouth 2 (two) times daily., Disp: , Rfl: ;   l-methylfolate-b2-b6-b12 (CEREFOLIN) 11-12-48-5 MG TABS, Take 1 tablet by mouth daily., Disp: , Rfl:  memantine (NAMENDA) 10 MG tablet, Take 10 mg by mouth 2 (two) times daily., Disp: , Rfl: ;   Multiple Vitamin (MULTIVITAMIN) tablet, Take 1 tablet by mouth daily., Disp: , Rfl: ;  simvastatin (ZOCOR) 40 MG tablet, Take 40 mg by mouth every evening., Disp: , Rfl: ;   tamsulosin (FLOMAX) 0.4 MG CAPS, Take 0.4 mg by mouth daily. , Disp: , Rfl: ;   vitamin B-12 (CYANOCOBALAMIN) 100 MCG tablet, Take 100 mcg by mouth daily., Disp: , Rfl:  vitamin C (ASCORBIC ACID) 500 MG tablet, Take 500 mg by mouth daily., Disp: , Rfl:   ROS: History obtained from the patient  General ROS: negative for - chills, fatigue, fever, night sweats, weight gain or weight loss Psychological ROS: memory difficulties Ophthalmic  ROS: negative for - blurry vision, double vision, eye pain or loss of vision ENT ROS: negative for - epistaxis, nasal discharge, oral lesions, sore throat, tinnitus or vertigo Allergy and Immunology ROS: negative for - hives or itchy/watery eyes Hematological and Lymphatic ROS: negative for - bleeding problems, bruising or swollen lymph nodes Endocrine ROS: negative for - galactorrhea, hair pattern changes, polydipsia/polyuria or temperature intolerance Respiratory ROS: negative for - cough, hemoptysis, shortness of breath or wheezing Cardiovascular ROS: negative for - chest pain, dyspnea on exertion, edema or irregular  heartbeat Gastrointestinal ROS: negative for - abdominal pain, diarrhea, hematemesis, nausea/vomiting or stool incontinence Genito-Urinary ROS: negative for - dysuria, hematuria, incontinence or urinary frequency/urgency Musculoskeletal ROS: negative for - joint swelling or muscular weakness Neurological ROS: as noted in HPI Dermatological ROS: negative for rash and skin lesion changes  Physical Examination: Blood pressure 157/74, pulse 59, temperature 98.1 F (36.7 C), temperature source Oral, resp. rate 14, height 5\' 7"  (1.702 m), weight 74.702 kg (164 lb 11 oz), SpO2 100.00%.  Neurologic Examination: Mental Status: Alert, oriented, thought content appropriate.  Speech fluent without evidence of aphasia.  Able to follow 3 step commands without difficulty. Cranial Nerves: II: Discs flat bilaterally; Visual fields grossly normal, pupils equal, round, reactive to light and accommodation III,IV, VI: ptosis not present, extra-ocular motions intact bilaterally V,VII: smile symmetric, facial light touch sensation normal bilaterally VIII: hearing normal bilaterally IX,X: gag reflex present XI: bilateral shoulder shrug XII: midline tongue extension Motor: Right : Upper extremity   5/5 distally with 0/5 hand grip   Left:     Upper extremity   5/5  Lower extremity   5/5        Lower extremity   5/5 Tone and bulk:normal tone throughout; no atrophy noted Sensory: Pinprick and light touch intact throughout, bilaterally Deep Tendon Reflexes: 2+ in the upper extremities, 1+ knee jerks and absent AJ's bilaterally Plantars: Right: mute   Left: mute Cerebellar: Dysmetria with finger-to-nose on the right Gait: Unable to test CV: pulses palpable throughout    Laboratory Studies:  Basic Metabolic Panel:  Recent Labs Lab 10/20/13 1447 10/20/13 1456  NA 139 141  K 5.0 4.5  CL 102 101  CO2 25  --   GLUCOSE 98 103*  BUN 19 20  CREATININE 1.05 1.30  CALCIUM 8.4  --     Liver Function  Tests:  Recent Labs Lab 10/20/13 1447  AST 31  ALT 19  ALKPHOS 51  BILITOT 0.3  PROT 6.3  ALBUMIN 3.2*   No results found for this basename: LIPASE, AMYLASE,  in the last 168 hours No results found for this basename: AMMONIA,  in the last 168 hours  CBC:  Recent Labs Lab 10/20/13 1447 10/20/13 1456  WBC 5.1  --   NEUTROABS 2.4  --   HGB 14.7 15.3  HCT 43.7 45.0  MCV 95.2  --   PLT 126*  --     Cardiac Enzymes: No results found for this basename: CKTOTAL, CKMB, CKMBINDEX, TROPONINI,  in the last 168 hours  BNP: No components found with this basename: POCBNP,   CBG: No results found for this basename: GLUCAP,  in the last 168 hours  Microbiology: Results for orders placed during the hospital encounter of 11/10/12  SURGICAL PCR SCREEN     Status: None   Collection Time    11/10/12  8:48 AM      Result Value Ref Range Status   MRSA, PCR NEGATIVE  NEGATIVE Final   Staphylococcus aureus NEGATIVE  NEGATIVE Final   Comment:            The Xpert SA Assay (FDA     approved for NASAL specimens     in patients over 72 years of age),     is one component of     a comprehensive surveillance     program.  Test performance has     been validated by [employer] for patients greater     than or equal to 84 year old.     It is not intended     to diagnose infection nor to     guide or monitor treatment.    Coagulation Studies:  Recent Labs  10/20/13 1447  LABPROT 13.3  INR 1.03    Urinalysis: No results found for this basename: COLORURINE, APPERANCEUR, LABSPEC, PHURINE, GLUCOSEU, HGBUR, BILIRUBINUR, KETONESUR, PROTEINUR, UROBILINOGEN, NITRITE, LEUKOCYTESUR,  in the last 168 hours  Lipid Panel: No results found for this basename: chol, trig, hdl, cholhdl, vldl, ldlcalc    HgbA1C:  No results found for this basename: HGBA1C    Urine Drug Screen:   No results found for this basename: labopia, cocainscrnur, labbenz, amphetmu, thcu, labbarb    Alcohol  Level:  Recent Labs Lab 10/20/13 1447  ETH <11    Other results: EKG: sinus rhythm at 58 bpm.  Imaging: Ct Head Wo Contrast  10/20/2013   CLINICAL DATA:  Code stroke, right arm weakness  EXAM: CT HEAD WITHOUT CONTRAST  TECHNIQUE: Contiguous axial images were obtained from the base of the skull through the vertex without intravenous contrast.  COMPARISON:  MR 11/17/2010  FINDINGS: Retention cyst or polyp in the right frontal sinus. Atherosclerotic and physiologic intracranial calcifications. Large lacunar infarct in the left basal ganglia and corona radiata with adjacent hypoattenuation in the adjacent white matter and some mild ex vacuo dilatation of the adjacent lateral ventricle. There is a medial left occipital lobe subacute or chronic infarct which was not conspicuous on the prior study. No acute intracranial hemorrhage, midline shift, mass, or mass effect. Moderate diffuse parenchymal atrophy. Mild patchy areas of hypoattenuation in deep and periventricular white matter bilaterally.  IMPRESSION: 1. Negative for bleed or other acute intracranial process. 2. Old left basal ganglia/corona radiata and left occipital infarcts. 3. Atrophy and nonspecific white matter changes. Critical Value/emergent results were called by telephone at the time of interpretation on 10/20/2013 at 2:49 PM to Dr. Doy Mince, who verbally acknowledged these results.   Electronically Signed   By: Arne Cleveland M.D.   On: 10/20/2013 14:49    Assessment: 80 y.o. male presenting with right upper extremity weakness and dysmetria.  Patient with a history of strokes in the past.  On ASA at home.  Head CT reviewed and shows evidence of prior infarcts but no evidence of acute changes.  Unclear as to when left occipital infarct noted on current imaging occurred.  NIHSS of 1.  Hand grip improving.  With this improvement and after conversation with family it was decided that tPA would not be administered.  Also unclear whe occipital  infarct occurred.  Patient to be admitted for stroke work up.    Stroke Risk Factors - hyperlipidemia and hypertension  Plan: 1. HgbA1c, fasting lipid panel 2. MRI, MRA  of the brain without contrast 3. PT consult, OT consult, Speech consult 4. Echocardiogram 5. Carotid dopplers 6. Prophylactic therapy-Antiplatelet med: Aspirin - dose 325mg  daily 7.  Risk factor modification 8. Telemetry monitoring 9. Frequent neuro checks   Case discussed with Dr. Lawson Fiscal, MD [facility] [phone] 10/20/2013, 3:58 PM

## 2013-10-20 NOTE — ED Notes (Signed)
80 yo male was noticed by care giver at 13:45 to have Alt mental status change and paralysis in Right Arm. Pt presents with slight aphasia, Alert, no pain. Vitals 151/85 HR 62 Resp 12. CBG 114.

## 2013-10-20 NOTE — ED Notes (Signed)
Pt knows that urine is needed pt does not have to void at this time pt was given a urinal

## 2013-10-20 NOTE — ED Notes (Signed)
MD at bedside. 

## 2013-10-20 NOTE — ED Notes (Signed)
Attempted Report 

## 2013-10-20 NOTE — Progress Notes (Addendum)
Received from the ER via stretcher; oriented to room and unit routine; family friend at bedside with private sitter; patient confused to place and time; vital signs taken; applied telemetry; stood patient to void with 2 assist;weakness in right arm and no grip; sent urine to lab; patient c/o not eating ordered patient dinner meal after paging MD on call. Report given to night RN, Delphina.

## 2013-10-20 NOTE — ED Provider Notes (Signed)
CSN: [id number]     Arrival date & time 10/20/13  1428 History   First MD Initiated Contact with Patient 10/20/13 1504     Chief Complaint  Patient presents with  . Code Stroke    An emergency department physician performed an initial assessment on this suspected stroke patient at 29. (Consider location/radiation/quality/duration/timing/severity/associated sxs/prior Treatment) Patient is a 80 y.o. male presenting with Acute Neurological Problem. The history is provided by the patient and a relative.  Cerebrovascular Accident This is a new problem. The current episode started today. The problem occurs constantly. The problem has been gradually improving. Associated symptoms include weakness. Pertinent negatives include no coughing, fever or visual change. Associated symptoms comments: Confusion and loss of function in his right upper extremity. Nothing aggravates the symptoms. He has tried nothing for the symptoms. The treatment provided no relief.    Past Medical History  Diagnosis Date  . Hypertension   . BPH (benign prostatic hyperplasia)     HAS INDWELLING FOLEY CATHETER  . Alzheimer disease   . Hyperlipidemia   . Erectile dysfunction   . Mild depression   . Spinal stenosis   . CVA (cerebral vascular accident)    Past Surgical History  Procedure Laterality Date  . Kidney surgery      right kidney removed  . Tonsilectomy, adenoidectomy, bilateral myringotomy and tubes    . Hernia repair Left 09/24/1996    Indirect, Dr Lucia Gaskins surgeon  . Transurethral resection of prostate N/A 11/13/2012    Procedure: BI POLAR TRANSURETHRAL RESECTION OF THE PROSTATE (TURP);  Surgeon: Fredricka Bonine, MD;  Location: [facility];  Service: Urology;  Laterality: N/A;   Family History  Problem Relation Age of Onset  . Heart disease Father    History  Substance Use Topics  . Smoking status: Former Smoker    Types: Cigarettes  . Smokeless tobacco: Not on file     Comment: QUIT SMOKING MANY  YEARS AGO  . Alcohol Use: Yes    Review of Systems  Constitutional: Negative for fever.  HENT: Negative for drooling and trouble swallowing.   Respiratory: Negative for cough.   Neurological: Positive for weakness.  Psychiatric/Behavioral: Positive for confusion.  All other systems reviewed and are negative.     Allergies  Review of patient's allergies indicates no known allergies.  Home Medications   Prior to Admission medications   Medication Sig Start Date End Date Taking? Authorizing Provider  amLODipine (NORVASC) 5 MG tablet Take 5 mg by mouth daily before breakfast.     Historical Provider, MD  aspirin 325 MG tablet Take 1 tablet (325 mg total) by mouth daily. 11/16/12   Fredricka Bonine, MD  cephALEXin (KEFLEX) 250 MG capsule Take 1 capsule (250 mg total) by mouth daily. 11/14/12   Dewayne Hatch. Izora Ribas, PA-C  cholecalciferol (VITAMIN D) 1000 UNITS tablet Take 1,000 Units by mouth daily.    Historical Provider, MD  citalopram (CELEXA) 10 MG tablet Take 10 mg by mouth daily.    Historical Provider, MD  divalproex (DEPAKOTE) 250 MG DR tablet Take 250 mg by mouth 2 (two) times daily.     Historical Provider, MD  galantamine (RAZADYNE) 12 MG tablet Take 12 mg by mouth 2 (two) times daily.    Historical Provider, MD  l-methylfolate-b2-b6-b12 (CEREFOLIN) 11-12-48-5 MG TABS Take 1 tablet by mouth daily.    Historical Provider, MD  memantine (NAMENDA) 10 MG tablet Take 10 mg by mouth 2 (two) times daily.  Historical Provider, MD  Multiple Vitamin (MULTIVITAMIN) tablet Take 1 tablet by mouth daily.    Historical Provider, MD  simvastatin (ZOCOR) 40 MG tablet Take 40 mg by mouth every evening.    Historical Provider, MD  tamsulosin (FLOMAX) 0.4 MG CAPS Take 0.4 mg by mouth daily.     Historical Provider, MD  vitamin B-12 (CYANOCOBALAMIN) 100 MCG tablet Take 100 mcg by mouth daily.    Historical Provider, MD  vitamin C (ASCORBIC ACID) 500 MG tablet Take 500 mg by mouth daily.     Historical Provider, MD   BP 157/76  Pulse 59  Temp(Src) 98.1 F (36.7 C) (Oral)  Resp 18  Ht 5\' 7"  (1.702 m)  Wt 164 lb 11 oz (74.702 kg)  BMI 25.79 kg/m2  SpO2 96% Physical Exam  Constitutional: He is oriented to person, place, and time. He appears well-developed and well-nourished. No distress.  HENT:  Head: Normocephalic and atraumatic.  Eyes: Conjunctivae are normal.  Neck: Neck supple. No tracheal deviation present.  Cardiovascular: Normal rate and regular rhythm.   Pulmonary/Chest: Effort normal. No respiratory distress.  Abdominal: Soft. He exhibits no distension.  Neurological: He is alert and oriented to person, place, and time. No cranial nerve deficit. GCS eye subscore is 4. GCS verbal subscore is 4. GCS motor subscore is 6.  Weakness and dysmetria of the right hand, full-strength of elbow joint muscles and shoulder on affected side  Skin: Skin is warm and dry.  Psychiatric: He has a normal mood and affect.    ED Course  Procedures (including critical care time) Labs Review Labs Reviewed  CBC - Abnormal; Notable for the following:    Platelets 126 (*)    All other components within normal limits  DIFFERENTIAL - Abnormal; Notable for the following:    Monocytes Relative 14 (*)    Eosinophils Relative 6 (*)    All other components within normal limits  I-STAT CHEM 8, ED - Abnormal; Notable for the following:    Glucose, Bld 103 (*)    All other components within normal limits  PROTIME-INR  APTT  ETHANOL  COMPREHENSIVE METABOLIC PANEL  URINE RAPID DRUG SCREEN (HOSP PERFORMED)  URINALYSIS, ROUTINE W REFLEX MICROSCOPIC  I-STAT TROPOININ, ED  I-STAT TROPOININ, ED    Imaging Review Ct Head Wo Contrast  10/20/2013   CLINICAL DATA:  Code stroke, right arm weakness  EXAM: CT HEAD WITHOUT CONTRAST  TECHNIQUE: Contiguous axial images were obtained from the base of the skull through the vertex without intravenous contrast.  COMPARISON:  MR 11/17/2010  FINDINGS:  Retention cyst or polyp in the right frontal sinus. Atherosclerotic and physiologic intracranial calcifications. Large lacunar infarct in the left basal ganglia and corona radiata with adjacent hypoattenuation in the adjacent white matter and some mild ex vacuo dilatation of the adjacent lateral ventricle. There is a medial left occipital lobe subacute or chronic infarct which was not conspicuous on the prior study. No acute intracranial hemorrhage, midline shift, mass, or mass effect. Moderate diffuse parenchymal atrophy. Mild patchy areas of hypoattenuation in deep and periventricular white matter bilaterally.  IMPRESSION: 1. Negative for bleed or other acute intracranial process. 2. Old left basal ganglia/corona radiata and left occipital infarcts. 3. Atrophy and nonspecific white matter changes. Critical Value/emergent results were called by telephone at the time of interpretation on 10/20/2013 at 2:49 PM to Dr. Doy Mince, who verbally acknowledged these results.   Electronically Signed   By: Arne Cleveland M.D.   On: 10/20/2013  14:49     EKG Interpretation None      MDM   Final diagnoses:  None    80 y.o. male presents with acute loss of function in his right upper extremity accompanied with confusion that started acutely while preparing to leave the house this morning about 4 hours ago. On arrival, code stroke was activated the neurology available at bedside for evaluation. He continues to have inability to control his right hand but is able to move the right arm distal to the elbow. He is unable to copy hand positioning or complete any complex tasks with affected hand. During his emergency department course he began to regain some function of the hand and at least was able to move his fingers.  Per neurology recommendation, the patient was admitted to the hospitalist service will plan to get emergent MRI and reassess the patient for further necessary intervention and outpatient followup.  Protecting airway throughout his emergency department course and no worsening deficits.    Leo Grosser, MD 10/21/13 [id number]

## 2013-10-20 NOTE — ED Notes (Signed)
Attempted Report 

## 2013-10-21 ENCOUNTER — Inpatient Hospital Stay (HOSPITAL_COMMUNITY): Payer: Medicare PPO

## 2013-10-21 DIAGNOSIS — E785 Hyperlipidemia, unspecified: Secondary | ICD-10-CM | POA: Diagnosis present

## 2013-10-21 DIAGNOSIS — N400 Enlarged prostate without lower urinary tract symptoms: Secondary | ICD-10-CM | POA: Diagnosis present

## 2013-10-21 LAB — LIPID PANEL
Cholesterol: 138 mg/dL (ref 0–200)
HDL: 44 mg/dL (ref >39)
LDL Cholesterol: 78 mg/dL (ref 0–99)
TRIGLYCERIDES: 80 mg/dL (ref <150)
Total CHOL/HDL Ratio: 3.1 RATIO
VLDL: 16 mg/dL (ref 0–40)

## 2013-10-21 LAB — GLUCOSE, CAPILLARY
GLUCOSE-CAPILLARY: 111 mg/dL — AB (ref 70–99)
Glucose-Capillary: 106 mg/dL — ABNORMAL HIGH (ref 70–99)
Glucose-Capillary: 86 mg/dL (ref 70–99)
Glucose-Capillary: 146 mg/dL — ABNORMAL HIGH (ref 70–99)

## 2013-10-21 LAB — HEMOGLOBIN A1C
Hgb A1c MFr Bld: 6.1 % — ABNORMAL HIGH (ref <5.7)
Mean Plasma Glucose: 128 mg/dL — ABNORMAL HIGH (ref <117)

## 2013-10-21 MED ORDER — HALOPERIDOL LACTATE 5 MG/ML IJ SOLN: 2.0000 mg | Freq: Four times a day (QID) | INTRAMUSCULAR | Status: DC | PRN

## 2013-10-21 MED ORDER — SODIUM CHLORIDE 0.9 % IV BOLUS (SEPSIS)
500.0000 mL | Freq: Once | INTRAVENOUS | Status: AC
  Administered 2013-10-21: 500 mL via INTRAVENOUS

## 2013-10-21 MED ORDER — LORAZEPAM 2 MG/ML IJ SOLN
1.0000 mg | Freq: Once | INTRAMUSCULAR | Status: AC
  Administered 2013-10-21: 1 mg via INTRAVENOUS
  Filled 2013-10-21: qty 1

## 2013-10-21 MED ORDER — ACETAMINOPHEN 325 MG PO TABS
650.0000 mg | ORAL_TABLET | Freq: Four times a day (QID) | ORAL | Status: DC | PRN
  Administered 2013-10-21: 650 mg via ORAL
  Filled 2013-10-21: qty 2

## 2013-10-21 NOTE — Progress Notes (Signed)
PT Cancellation Note  Patient Details Name: Trevor Lutz MRN: [id number] DOB: 02-15-1922   Cancelled Treatment:    Reason Eval/Treat Not Completed: Patient at procedure or test/unavailable (Patient at MRI)   Duncan Dull 10/21/2013, 10:43 AM Alben Deeds, PT DPT  [phone]

## 2013-10-21 NOTE — Progress Notes (Signed)
Subjective: Patient awake and alert. Has been agitated throughout the night but did get some sleep.  On arising noted to have worsening strength in the RUE.  Patient did not ever experience full return to baseline therefore not in the tPA window.  On Plavix now.  Was previously on ASA at home.    Objective: Current vital signs: BP 117/58  Pulse 57  Temp(Src) 98 F (36.7 C) (Oral)  Resp 18  Ht 5\' 7"  (1.702 m)  Wt 74.702 kg (164 lb 11 oz)  BMI 25.79 kg/m2  SpO2 94% Vital signs in last 24 hours: Temp:  [97 F (36.1 C)-98.7 F (37.1 C)] 98 F (36.7 C) (05/10 0604) Pulse Rate:  [55-63] 57 (05/10 0604) Resp:  [14-21] 18 (05/10 0604) BP: (117-173)/(50-78) 117/58 mmHg (05/10 0604) SpO2:  [94 %-100 %] 94 % (05/10 0604) Weight:  [74.702 kg (164 lb 11 oz)] 74.702 kg (164 lb 11 oz) (05/09 1526)  Intake/Output from previous day: 05/09 0701 - 05/10 0700 In: -  Out: 400 [Urine:400] Intake/Output this shift:   Nutritional status:    Neurologic Exam: Mental Status:  Alert, calm. Speech fluent without evidence of aphasia. Able to follow 3 step commands without difficulty.  Cranial Nerves:  II: Discs flat bilaterally; Visual fields grossly normal, pupils equal, round, reactive to light and accommodation  III,IV, VI: ptosis not present, extra-ocular motions intact bilaterally  V,VII: smile symmetric, facial light touch sensation normal bilaterally  VIII: hearing normal bilaterally  IX,X: gag reflex present  XI: bilateral shoulder shrug  XII: midline tongue extension  Motor:  Right : Upper extremity 0/5       Left: Upper extremity 5/5   Lower extremity 5/5     Lower extremity 5/5  Tone and bulk:normal tone throughout; no atrophy noted  Sensory: Pinprick and light touch decreased in the RUE Deep Tendon Reflexes: 2+ in the upper extremities, 1+ knee jerks and absent AJ's bilaterally  Plantars:  Right: mute     Left: mute  Cerebellar:  Unable to perform finger to nose testing with the  right  Gait: Unable to test  CV: pulses palpable throughout      Lab Results: Basic Metabolic Panel:  Recent Labs Lab 10/20/13 1447 10/20/13 1456  NA 139 141  K 5.0 4.5  CL 102 101  CO2 25  --   GLUCOSE 98 103*  BUN 19 20  CREATININE 1.05 1.30  CALCIUM 8.4  --     Liver Function Tests:  Recent Labs Lab 10/20/13 1447  AST 31  ALT 19  ALKPHOS 51  BILITOT 0.3  PROT 6.3  ALBUMIN 3.2*   No results found for this basename: LIPASE, AMYLASE,  in the last 168 hours No results found for this basename: AMMONIA,  in the last 168 hours  CBC:  Recent Labs Lab 10/20/13 1447 10/20/13 1456  WBC 5.1  --   NEUTROABS 2.4  --   HGB 14.7 15.3  HCT 43.7 45.0  MCV 95.2  --   PLT 126*  --     Cardiac Enzymes: No results found for this basename: CKTOTAL, CKMB, CKMBINDEX, TROPONINI,  in the last 168 hours  Lipid Panel:  Recent Labs Lab 10/21/13 0515  CHOL 138  TRIG 80  HDL 44  CHOLHDL 3.1  VLDL 16  LDLCALC 78    CBG:  Recent Labs Lab 10/20/13 2216 10/21/13 0647  GLUCAP 153* 111*    Microbiology: Results for orders placed during the hospital encounter of  11/10/12  SURGICAL PCR SCREEN     Status: None   Collection Time    11/10/12  8:48 AM      Result Value Ref Range Status   MRSA, PCR NEGATIVE  NEGATIVE Final   Staphylococcus aureus NEGATIVE  NEGATIVE Final   Comment:            The Xpert SA Assay (FDA     approved for NASAL specimens     in patients over 42 years of age),     is one component of     a comprehensive surveillance     program.  Test performance has     been validated by [employer] for patients greater     than or equal to 51 year old.     It is not intended     to diagnose infection nor to     guide or monitor treatment.    Coagulation Studies:  Recent Labs  10/20/13 1447  LABPROT 13.3  INR 1.03    Imaging: Dg Chest 2 View  10/20/2013   CLINICAL DATA:  Stroke.  Current history of hypertension.  EXAM: CHEST  2 VIEW   COMPARISON:  DG CHEST 2 VIEW dated 11/10/2012; DG CHEST 2 VIEW dated 02/27/2004.  FINDINGS: Suboptimal inspiration accounts for crowded bronchovascular markings, especially in the bases, and accentuates the cardiac silhouette. Taking this into account, cardiac silhouette upper normal in size and stable. Thoracic aorta mildly atherosclerotic. Hilar and mediastinal contours otherwise unremarkable. 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 Chronic linear scar/atelectasis in the right middle lobe, unchanged. Lungs otherwise clear. No localized airspace consolidation. No pleural effusions. No pneumothorax. Normal pulmonary vascularity. Degenerative changes involving the thoracic spine.  IMPRESSION: Suboptimal inspiration. No acute cardiopulmonary disease. Chronic scar/atelectasis in the right middle lobe related to chronic eventration of the right anterior hemidiaphragm.   Electronically Signed   By: Evangeline Dakin M.D.   On: 10/20/2013 21:15   Ct Head Wo Contrast  10/20/2013   CLINICAL DATA:  Code stroke, right arm weakness  EXAM: CT HEAD WITHOUT CONTRAST  TECHNIQUE: Contiguous axial images were obtained from the base of the skull through the vertex without intravenous contrast.  COMPARISON:  MR 11/17/2010  FINDINGS: Retention cyst or polyp in the right frontal sinus. Atherosclerotic and physiologic intracranial calcifications. Large lacunar infarct in the left basal ganglia and corona radiata with adjacent hypoattenuation in the adjacent white matter and some mild ex vacuo dilatation of the adjacent lateral ventricle. There is a medial left occipital lobe subacute or chronic infarct which was not conspicuous on the prior study. No acute intracranial hemorrhage, midline shift, mass, or mass effect. Moderate diffuse parenchymal atrophy. Mild patchy areas of hypoattenuation in deep and periventricular white matter bilaterally.  IMPRESSION: 1. Negative for bleed  or other acute intracranial process. 2. Old left basal ganglia/corona radiata and left occipital infarcts. 3. Atrophy and nonspecific white matter changes. Critical Value/emergent results were called by telephone at the time of interpretation on 10/20/2013 at 2:49 PM to Dr. Doy Mince, who verbally acknowledged these results.   Electronically Signed   By: Arne Cleveland M.D.   On: 10/20/2013 14:49    Medications:  I have reviewed the patient's current medications. Scheduled: . citalopram  10 mg Oral Daily  . clopidogrel  75 mg Oral Q breakfast  . divalproex  250 mg Oral BID  . galantamine  12 mg Oral BID  WC  . heparin  5,000 Units Subcutaneous 3 times per day  . insulin aspart  0-9 Units Subcutaneous TID WC  . LORazepam  1 mg Intravenous Once  . memantine  10 mg Oral BID  . simvastatin  40 mg Oral QPM  . tamsulosin  0.4 mg Oral QHS  . vitamin B-12  100 mcg Oral Daily    Assessment/Plan: Patient with decreased strength in the RUE this morning.  Initial head CT showed no acute changes.  MRI remains pending.  LDL 78. A1c pending  Recommendations: 1.  MRI contacted and urgency of MRI changed to STAT.  They will contact 4N for premedication administration 2.  Continue neuro checks 3.  Echocardiogram and carotid dopplers are pending 4.  PT/OT   LOS: 1 day   Alexis Goodell, MD [facility] [phone] 10/21/2013  9:54 AM

## 2013-10-21 NOTE — ED Provider Notes (Signed)
I saw and evaluated the patient, reviewed the resident's note and I agree with the findings and plan.  Pertinent History: Pt had acute onset of confusion and RUE dysmetria.  Seen by Dr. Doy Mince on arrival - Stroke scale measurement was low and according to Dr. Doy Mince too low to give TPA.   Pertinent Exam findings: slight confusion (pt has dementia at baseline), has dysmetria of RUE, no other acute findings on physical exam / neuro exam   EKG Interpretation  Date/Time:  Saturday Oct 20 2013 14:47:06 EDT Ventricular Rate:  58 PR Interval:  170 QRS Duration: 95 QT Interval:  428 QTC Calculation: 420 R Axis:   67 Text Interpretation:  Sinus bradycardia ECG OTHERWISE WITHIN NORMAL LIMITS since last tracing no significant change Confirmed by Bryam Taborda  MD, Keionte Swicegood ([id number]) on 10/20/2013 4:14:52 PM      To be admitted to medical service.  Johnna Acosta, MD 10/21/13 1213

## 2013-10-21 NOTE — Progress Notes (Signed)
More awake now; voice is clear; still unable to  Move his right arm; reports full sensation.  No acute distress; will attempt to eat his dinner; still awaiting 2Decho and dopplers and therapy evals. In am.

## 2013-10-21 NOTE — Progress Notes (Addendum)
Patient awake; attempting to eat breakfast; speech is slurred and not able to resist gravity or move his right arm; no grip.  Patient anxious and speaking loudly; cooperative with taking plavix; Dr. Candiss Norse in to see patient and Dr. Doy Mince notified of decreased response in the RUE.  Sedative requested for MRI; patient can't tolerate scan per caregiver at bedside.

## 2013-10-21 NOTE — Progress Notes (Signed)
Patient Demographics  Trevor Lutz, is a 80 y.o. male, DOB - 12-13-1921, QBH:[id number]  Admit date - 10/20/2013   Admitting Physician Charlynne Cousins, MD  Outpatient Primary MD for the patient is Irven Shelling, MD  LOS - 1   Chief Complaint  Patient presents with  . Code Stroke        Assessment & Plan    1. 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 He has had previous history of CVAs, was on aspirin and switched to Plavix by neurology, will be seen by PT-OT to his speech, await MRI MRA brain along with echogram and carotid duplex.    No results found for this basename: HGBA1C    Lab Results  Component Value Date   CHOL 138 10/21/2013   HDL 44 10/21/2013   LDLCALC 78 10/21/2013   TRIG 80 10/21/2013   CHOLHDL 3.1 10/21/2013     2. Underlying Alzheimer's dementia. At risk for delirium, we'll continue supportive care with Depakote along with Namenda.    3. BPH. Continue Flomax.    4. Dyslipidemia. On home dose statin. Lipid panel stable HEENT.    5. Attention. Allowing for permissive hypertension. As needed IV hydralazine.      Code Status: Full  Family Communication: Called wife Horris Latino and listed family member Adonis Brook no response, on 10/21/2013 @ 9 AM  Disposition Plan: To be decided may require placement in   Procedures CT head, MRI brain, MRA head, echo, carotid duplex   Consults  Neuro   Medications  Scheduled Meds: . citalopram  10 mg Oral Daily  . clopidogrel  75 mg Oral Q breakfast  . divalproex  250 mg Oral BID  . galantamine  12 mg Oral BID WC  . heparin  5,000 Units Subcutaneous 3 times per day  . insulin aspart  0-9 Units Subcutaneous TID WC  . LORazepam  1 mg Intravenous Once  . memantine  10 mg Oral BID   . simvastatin  40 mg Oral QPM  . tamsulosin  0.4 mg Oral QHS  . vitamin B-12  100 mcg Oral Daily   Continuous Infusions:  PRN Meds:.acetaminophen, haloperidol lactate, senna-docusate  DVT Prophylaxis   heparin  Lab Results  Component Value Date   PLT 126* 10/20/2013    Antibiotics     Anti-infectives   None          Subjective:   Trevor Lutz today has, No headache, No chest pain, No abdominal pain - No Nausea, No new weakness tingling or numbness, No Cough - SOB.  He realizes that his speech is off and that his right side is weak.  Objective:   Filed Vitals:   10/21/13 0018 10/21/13 0218 10/21/13 0400 10/21/13 0604  BP: 121/57 143/66 145/56 117/58  Pulse: 62 63 62 57  Temp: 97.5 F (36.4 C) 97.5 F (36.4 C) 98 F (36.7 C) 98 F (36.7 C)  TempSrc: Oral Oral Oral Oral  Resp: 20 20 18 18   Height:      Weight:      SpO2: 94% 95% 95% 94%    Wt Readings from Last 3 Encounters:  10/20/13 74.702 kg (164 lb 11 oz)  11/13/12 73.392 kg (161 lb 12.8  oz)  11/13/12 73.392 kg (161 lb 12.8 oz)     Intake/Output Summary (Last 24 hours) at 10/21/13 0903 Last data filed at 10/21/13 0147  Gross per 24 hour  Intake      0 ml  Output    400 ml  Net   -400 ml     Physical Exam  Awake , not Alert, Oriented X 0,R side 4/5, Exp aphasia, Normal affect [place].AT,PERRAL Supple Neck,No JVD, No cervical lymphadenopathy appriciated.  Symmetrical Chest wall movement, Good air movement bilaterally, CTAB RRR,No Gallops,Rubs or new Murmurs, No Parasternal Heave +ve B.Sounds, Abd Soft, Non tender, No organomegaly appriciated, No rebound - guarding or rigidity. Foley No Cyanosis, Clubbing or edema, No new Rash or bruise      Data Review   Micro Results No results found for this or any previous visit (from the past 240 hour(s)).  Radiology Reports Dg Chest 2 View  10/20/2013   CLINICAL DATA:  Stroke.  Current history of hypertension.  EXAM: CHEST  2 VIEW  COMPARISON:  DG CHEST 2  VIEW dated 11/10/2012; DG CHEST 2 VIEW dated 02/27/2004.  FINDINGS: Suboptimal inspiration accounts for crowded bronchovascular markings, especially in the bases, and accentuates the cardiac silhouette. Taking this into account, cardiac silhouette upper normal in size and stable. Thoracic aorta mildly atherosclerotic. Hilar and mediastinal contours otherwise unremarkable. 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 Chronic linear scar/atelectasis in the right middle lobe, unchanged. Lungs otherwise clear. No localized airspace consolidation. No pleural effusions. No pneumothorax. Normal pulmonary vascularity. Degenerative changes involving the thoracic spine.  IMPRESSION: Suboptimal inspiration. No acute cardiopulmonary disease. Chronic scar/atelectasis in the right middle lobe related to chronic eventration of the right anterior hemidiaphragm.   Electronically Signed   By: Evangeline Dakin M.D.   On: 10/20/2013 21:15   Ct Head Wo Contrast  10/20/2013   CLINICAL DATA:  Code stroke, right arm weakness  EXAM: CT HEAD WITHOUT CONTRAST  TECHNIQUE: Contiguous axial images were obtained from the base of the skull through the vertex without intravenous contrast.  COMPARISON:  MR 11/17/2010  FINDINGS: Retention cyst or polyp in the right frontal sinus. Atherosclerotic and physiologic intracranial calcifications. Large lacunar infarct in the left basal ganglia and corona radiata with adjacent hypoattenuation in the adjacent white matter and some mild ex vacuo dilatation of the adjacent lateral ventricle. There is a medial left occipital lobe subacute or chronic infarct which was not conspicuous on the prior study. No acute intracranial hemorrhage, midline shift, mass, or mass effect. Moderate diffuse parenchymal atrophy. Mild patchy areas of hypoattenuation in deep and periventricular white matter bilaterally.  IMPRESSION: 1. Negative for bleed or other acute  intracranial process. 2. Old left basal ganglia/corona radiata and left occipital infarcts. 3. Atrophy and nonspecific white matter changes. Critical Value/emergent results were called by telephone at the time of interpretation on 10/20/2013 at 2:49 PM to Dr. Doy Mince, who verbally acknowledged these results.   Electronically Signed   By: Arne Cleveland M.D.   On: 10/20/2013 14:49    CBC  Recent Labs Lab 10/20/13 1447 10/20/13 1456  WBC 5.1  --   HGB 14.7 15.3  HCT 43.7 45.0  PLT 126*  --   MCV 95.2  --   MCH 32.0  --   MCHC 33.6  --   RDW 14.4  --   LYMPHSABS 1.7  --   MONOABS 0.7  --   EOSABS 0.3  --  BASOSABS 0.0  --     Chemistries   Recent Labs Lab 10/20/13 1447 10/20/13 1456  NA 139 141  K 5.0 4.5  CL 102 101  CO2 25  --   GLUCOSE 98 103*  BUN 19 20  CREATININE 1.05 1.30  CALCIUM 8.4  --   AST 31  --   ALT 19  --   ALKPHOS 51  --   BILITOT 0.3  --    ------------------------------------------------------------------------------------------------------------------ estimated creatinine clearance is 36 ml/min (by C-G formula based on Cr of 1.3). ------------------------------------------------------------------------------------------------------------------ No results found for this basename: HGBA1C,  in the last 72 hours ------------------------------------------------------------------------------------------------------------------  Recent Labs  10/21/13 0515  CHOL 138  HDL 44  LDLCALC 78  TRIG 80  CHOLHDL 3.1   ------------------------------------------------------------------------------------------------------------------ No results found for this basename: TSH, T4TOTAL, FREET3, T3FREE, THYROIDAB,  in the last 72 hours ------------------------------------------------------------------------------------------------------------------ No results found for this basename: VITAMINB12, FOLATE, FERRITIN, TIBC, IRON, RETICCTPCT,  in the last 72  hours  Coagulation profile  Recent Labs Lab 10/20/13 1447  INR 1.03    No results found for this basename: DDIMER,  in the last 72 hours  Cardiac Enzymes No results found for this basename: CK, CKMB, TROPONINI, MYOGLOBIN,  in the last 168 hours ------------------------------------------------------------------------------------------------------------------ No components found with this basename: POCBNP,      Time Spent in minutes   35   Thurnell Lose M.D on 10/21/2013 at 9:03 AM  Between 7am to 7pm - Pager - [phone]  After 7pm go to [email or web address] - password [email or web address]  And look for the night coverage person covering for me after hours  [facility] Office  [phone]

## 2013-10-22 ENCOUNTER — Ambulatory Visit: Payer: Self-pay | Admitting: Neurology

## 2013-10-22 DIAGNOSIS — I517 Cardiomegaly: Secondary | ICD-10-CM

## 2013-10-22 LAB — GLUCOSE, CAPILLARY
GLUCOSE-CAPILLARY: 99 mg/dL (ref 70–99)
GLUCOSE-CAPILLARY: 105 mg/dL — AB (ref 70–99)
GLUCOSE-CAPILLARY: 96 mg/dL (ref 70–99)
Glucose-Capillary: 124 mg/dL — ABNORMAL HIGH (ref 70–99)

## 2013-10-22 NOTE — Progress Notes (Signed)
Patient Demographics  Trevor Lutz, is a 80 y.o. male, DOB - 1922/04/15, PNT:[id number]  Admit date - 10/20/2013   Admitting Physician Trevor Cousins, MD  Outpatient Primary MD for the patient is Trevor Shelling, MD  LOS - 2   Chief Complaint  Patient presents with  . Code Stroke        Assessment & Plan    1. 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 He has had previous history of CVAs, was on aspirin and switched to Plavix by neurology, seen by PT-OT & speech, noted MRI MRA brain, pending echogram and carotid duplex. May need TEE will defer to neurology.    Lab Results  Component Value Date   HGBA1C 6.1* 10/21/2013    Lab Results  Component Value Date   CHOL 138 10/21/2013   HDL 44 10/21/2013   LDLCALC 78 10/21/2013   TRIG 80 10/21/2013   CHOLHDL 3.1 10/21/2013     2. Underlying Alzheimer's dementia. At risk for delirium, we'll continue supportive care with Depakote along with Namenda.    3. BPH. Continue Flomax.    4. Dyslipidemia. On home dose statin. Lipid panel stable.    5. HTN. Allowing for permissive hypertension. As needed IV hydralazine.       Code Status: Full  Family Communication: Updated wife Trevor Lutz and brother-in-law who is a physician on 10/21/2013 @ 6 PM  Disposition Plan: SNF   Procedures CT head, MRI brain, MRA head, echo, carotid duplex   Consults  Neuro   Medications  Scheduled Meds: . citalopram  10 mg Oral Daily  . clopidogrel  75 mg Oral Q breakfast  . divalproex  250 mg Oral BID  . galantamine  12 mg Oral BID WC  . heparin  5,000 Units Subcutaneous 3 times per day  . insulin aspart  0-9 Units Subcutaneous TID WC  . memantine  10 mg Oral BID  . simvastatin  40 mg Oral QPM  . tamsulosin   0.4 mg Oral QHS  . vitamin B-12  100 mcg Oral Daily   Continuous Infusions:  PRN Meds:.acetaminophen, haloperidol lactate, senna-docusate  DVT Prophylaxis   heparin  Lab Results  Component Value Date   PLT 126* 10/20/2013    Antibiotics     Anti-infectives   None          Subjective:   Trevor Lutz today has, No headache, No chest pain, No abdominal pain - No Nausea, No new weakness tingling or numbness, No Cough - SOB.  He realizes that his speech is off and that his right side is weak.  Objective:   Filed Vitals:   10/21/13 1750 10/21/13 2120 10/22/13 0201 10/22/13 0629  BP: 148/73 138/64 136/66 134/74  Pulse: 67 56 60 57  Temp: 97.6 F (36.4 C) 98.5 F (36.9 C) 97.4 F (36.3 C) 97.7 F (36.5 C)  TempSrc: Oral Oral Oral Oral  Resp: 18 18 16 18   Height:      Weight:      SpO2: 95% 96% 96% 93%    Wt Readings from Last 3 Encounters:  10/20/13 74.702 kg (164 lb 11 oz)  11/13/12 73.392 kg (161 lb 12.8 oz)  11/13/12 73.392 kg (161 lb 12.8  oz)     Intake/Output Summary (Last 24 hours) at 10/22/13 0925 Last data filed at 10/21/13 1750  Gross per 24 hour  Intake      0 ml  Output    500 ml  Net   -500 ml     Physical Exam  Awake , not Alert, Oriented X 0,R side 4/5, Exp aphasia, Normal affect Trevor Lutz.AT,PERRAL Supple Neck,No JVD, No cervical lymphadenopathy appriciated.  Symmetrical Chest wall movement, Good air movement bilaterally, CTAB RRR,No Gallops,Rubs or new Murmurs, No Parasternal Heave +ve B.Sounds, Abd Soft, Non tender, No organomegaly appriciated, No rebound - guarding or rigidity. Foley No Cyanosis, Clubbing or edema, No new Rash or bruise      Data Review   Micro Results No results found for this or any previous visit (from the past 240 hour(s)).  Radiology Reports Dg Chest 2 View  10/20/2013   CLINICAL DATA:  Stroke.  Current history of hypertension.  EXAM: CHEST  2 VIEW  COMPARISON:  DG CHEST 2 VIEW dated 11/10/2012; DG CHEST 2 VIEW dated  02/27/2004.  FINDINGS: Suboptimal inspiration accounts for crowded bronchovascular markings, especially in the bases, and accentuates the cardiac silhouette. Taking this into account, cardiac silhouette upper normal in size and stable. Thoracic aorta mildly atherosclerotic. Hilar and mediastinal contours otherwise unremarkable. 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 Chronic linear scar/atelectasis in the right middle lobe, unchanged. Lungs otherwise clear. No localized airspace consolidation. No pleural effusions. No pneumothorax. Normal pulmonary vascularity. Degenerative changes involving the thoracic spine.  IMPRESSION: Suboptimal inspiration. No acute cardiopulmonary disease. Chronic scar/atelectasis in the right middle lobe related to chronic eventration of the right anterior hemidiaphragm.   Electronically Signed   By: Evangeline Dakin M.D.   On: 10/20/2013 21:15   Ct Head Wo Contrast  10/20/2013   CLINICAL DATA:  Code stroke, right arm weakness  EXAM: CT HEAD WITHOUT CONTRAST  TECHNIQUE: Contiguous axial images were obtained from the base of the skull through the vertex without intravenous contrast.  COMPARISON:  MR 11/17/2010  FINDINGS: Retention cyst or polyp in the right frontal sinus. Atherosclerotic and physiologic intracranial calcifications. Large lacunar infarct in the left basal ganglia and corona radiata with adjacent hypoattenuation in the adjacent white matter and some mild ex vacuo dilatation of the adjacent lateral ventricle. There is a medial left occipital lobe subacute or chronic infarct which was not conspicuous on the prior study. No acute intracranial hemorrhage, midline shift, mass, or mass effect. Moderate diffuse parenchymal atrophy. Mild patchy areas of hypoattenuation in deep and periventricular white matter bilaterally.  IMPRESSION: 1. Negative for bleed or other acute intracranial process. 2. Old left basal  ganglia/corona radiata and left occipital infarcts. 3. Atrophy and nonspecific white matter changes. Critical Value/emergent results were called by telephone at the time of interpretation on 10/20/2013 at 2:49 PM to Dr. Doy Mince, who verbally acknowledged these results.   Electronically Signed   By: Arne Cleveland M.D.   On: 10/20/2013 14:49    CBC  Recent Labs Lab 10/20/13 1447 10/20/13 1456  WBC 5.1  --   HGB 14.7 15.3  HCT 43.7 45.0  PLT 126*  --   MCV 95.2  --   MCH 32.0  --   MCHC 33.6  --   RDW 14.4  --   LYMPHSABS 1.7  --   MONOABS 0.7  --   EOSABS 0.3  --   BASOSABS 0.0  --  Chemistries   Recent Labs Lab 10/20/13 1447 10/20/13 1456  NA 139 141  K 5.0 4.5  CL 102 101  CO2 25  --   GLUCOSE 98 103*  BUN 19 20  CREATININE 1.05 1.30  CALCIUM 8.4  --   AST 31  --   ALT 19  --   ALKPHOS 51  --   BILITOT 0.3  --    ------------------------------------------------------------------------------------------------------------------ estimated creatinine clearance is 36 ml/min (by C-G formula based on Cr of 1.3). ------------------------------------------------------------------------------------------------------------------  Recent Labs  10/21/13 0515  HGBA1C 6.1*   ------------------------------------------------------------------------------------------------------------------  Recent Labs  10/21/13 0515  CHOL 138  HDL 44  LDLCALC 78  TRIG 80  CHOLHDL 3.1   ------------------------------------------------------------------------------------------------------------------ No results found for this basename: TSH, T4TOTAL, FREET3, T3FREE, THYROIDAB,  in the last 72 hours ------------------------------------------------------------------------------------------------------------------ No results found for this basename: VITAMINB12, FOLATE, FERRITIN, TIBC, IRON, RETICCTPCT,  in the last 72 hours  Coagulation profile  Recent Labs Lab 10/20/13 1447  INR  1.03    No results found for this basename: DDIMER,  in the last 72 hours  Cardiac Enzymes No results found for this basename: CK, CKMB, TROPONINI, MYOGLOBIN,  in the last 168 hours ------------------------------------------------------------------------------------------------------------------ No components found with this basename: POCBNP,      Time Spent in minutes   35   Thurnell Lose M.D on 10/22/2013 at 9:25 AM  Between 7am to 7pm - Pager - [phone]  After 7pm go to [email or web address] - password [email or web address]  And look for the night coverage person covering for me after hours  [facility] Office  [phone]

## 2013-10-22 NOTE — Progress Notes (Signed)
VASCULAR LAB PRELIMINARY  PRELIMINARY  PRELIMINARY  PRELIMINARY  Carotid Dopplers completed.    Preliminary report:  1-39% ICA stenosis.  Vertebral artery flow is antegrade.  Iantha Fallen, RVT 10/22/2013, 10:11 AM

## 2013-10-22 NOTE — Evaluation (Signed)
Speech Language Pathology Evaluation Patient Details Name: Trevor Lutz MRN: [id number] DOB: 1921/10/08 Today's Date: 10/22/2013 Time: 9628-3662 SLP Time Calculation (min): 39 min  Problem List:  Patient Active Problem List   Diagnosis Date Noted  . BPH (benign prostatic hyperplasia) 10/21/2013  . Dyslipidemia 10/21/2013  . Dysmetria 10/20/2013  . CVA (cerebral infarction) 10/20/2013  . Essential hypertension 10/20/2013  . Dementia 10/20/2013  . Right inguinal hernia 09/26/2012   Past Medical History:  Past Medical History  Diagnosis Date  . Hypertension   . BPH (benign prostatic hyperplasia)     HAS INDWELLING FOLEY CATHETER  . Alzheimer disease   . Hyperlipidemia   . Erectile dysfunction   . Mild depression   . Spinal stenosis   . CVA (cerebral vascular accident)    Past Surgical History:  Past Surgical History  Procedure Laterality Date  . Kidney surgery      right kidney removed  . Tonsilectomy, adenoidectomy, bilateral myringotomy and tubes    . Hernia repair Left 09/24/1996    Indirect, Dr Lucia Gaskins surgeon  . Transurethral resection of prostate N/A 11/13/2012    Procedure: BI POLAR TRANSURETHRAL RESECTION OF THE PROSTATE (TURP);  Surgeon: Fredricka Bonine, MD;  Location: [facility];  Service: Urology;  Laterality: N/A;   HPI:  Trevor Lutz is an 80 y.o. male who was with his caregiver today.  She noted acute onset difficulty with speech and right upper extremity weakness.  When symptoms did not improve EMS was called and the patient was brought in as a code stroke. MRI revealed acute left frontal, parietal, and occipital lobe infarcts.   Assessment / Plan / Recommendation Clinical Impression  Pt presents with moderate-severe impairments with storage and recall of information, intellectual awareness, following >1 step commands, and basic problem solving, all of which impact his safety awareness at this time. He exhibits a mild-moderate expressive aphasia  characterized by phonemic paraphasias and difficulty with divergent naming tasks, which may be exacerbated by cognitive function as well. Given his remote CVAs and h/o dementia requiring 24/7 assistance with hired help, it is difficult to determine what his baseline level of functioning was. Pt will benefit from skilled SLP services in the acute care setting at least until baseline can be determined.     SLP Assessment  Patient needs continued Speech Lanaguage Pathology Services    Follow Up Recommendations  24 hour supervision/assistance (TBD pending discussion with wife regarding baseline level of function)    Frequency and Duration min 1 x/week  2 weeks   Pertinent Vitals/Pain N/A   SLP Goals  SLP Goals Potential to Achieve Goals: Fair Potential Considerations: Ability to learn/carryover information;Previous level of function;Severity of impairments  SLP Evaluation Prior Functioning  Cognitive/Linguistic Baseline: Baseline deficits Baseline deficit details: per caregiver pt has a h/o difficulty remembering things, but could not verify if he was at his baseline for cognitive function - she does report that she has noticed some difficulty getting his words out  Lives With: Spouse;Other (Comment) (hired caregivers - per RN, pt is not left alone) Available Help at Discharge: Family;Personal care attendant;Available 24 hours/day Vocation: Retired (former Chief Executive Officer)   Cognition  Overall Cognitive Status: Difficult to assess Arousal/Alertness: Awake/alert Orientation Level: Oriented to person;Oriented to place;Disoriented to time;Disoriented to situation (pt can state that he had a stroke but does not understand that this is the cause of his new impairments) Attention: Sustained Sustained Attention: Impaired Sustained Attention Impairment: Functional basic;Verbal basic Memory: Impaired Memory Impairment:  Storage deficit;Decreased recall of new information Awareness: Impaired Awareness  Impairment: Intellectual impairment Problem Solving: Impaired Problem Solving Impairment: Functional basic;Verbal basic Executive Function:  (likely all impaired due to lower level deficits) Behaviors: Poor frustration tolerance (pt became intermittently frustrated but was easily redirected) Safety/Judgment: Impaired    Comprehension  Auditory Comprehension Overall Auditory Comprehension: Impaired Yes/No Questions: Impaired Basic Biographical Questions: 76-100% accurate Basic Immediate Environment Questions: 75-100% accurate Complex Questions: 50-74% accurate Commands: Impaired One Step Basic Commands: 75-100% accurate Two Step Basic Commands: 50-74% accurate Multistep Basic Commands: 50-74% accurate Conversation: Simple Interfering Components: Attention;Processing speed;Working Curator: Within [employer] Reading Comprehension Reading Status: Not tested    Expression Expression Primary Mode of Expression: Verbal Verbal Expression Overall Verbal Expression: Impaired Initiation: No impairment Automatic Speech: Name;Social Response Level of Generative/Spontaneous Verbalization: Phrase Repetition: No impairment Naming: Impairment Confrontation: Within functional limits Divergent: 0-24% accurate Verbal Errors: Phonemic paraphasias;Not aware of errors Pragmatics: No impairment Interfering Components: Attention Non-Verbal Means of Communication: Not applicable Written Expression Written Expression: Not tested   Oral / Motor Oral Motor/Sensory Function Overall Oral Motor/Sensory Function: Appears within functional limits for tasks assessed Motor Speech Overall Motor Speech: Appears within functional limits for tasks assessed   GO      Germain Osgood, M.A. CCC-SLP [phone]  Germain Osgood 10/22/2013, 2:04 PM

## 2013-10-22 NOTE — Progress Notes (Signed)
Stroke Team Progress Note  HISTORY Trevor Lutz is an 80 y.o. male who was with his caregiver today. She noted acute onset difficulty with speech and right upper extremity weakness. When symptoms did not improve EMS was called and the patient was brought in as a code stroke. Initial NIHSS of 1.  Patient with history of stroke in the past.  Date last known well: Date: 10/20/2013  Time last known well: Time: 13:35  tPA Given: No: Improvement in symptoms, low NIHSS    SUBJECTIVE The patient has a friend present today. He feels his right upper extremity is weaker.  OBJECTIVE Most recent Vital Signs: Filed Vitals:   10/21/13 1750 10/21/13 2120 10/22/13 0201 10/22/13 0629  BP: 148/73 138/64 136/66 134/74  Pulse: 67 56 60 57  Temp: 97.6 F (36.4 C) 98.5 F (36.9 C) 97.4 F (36.3 C) 97.7 F (36.5 C)  TempSrc: Oral Oral Oral Oral  Resp: 18 18 16 18   Height:      Weight:      SpO2: 95% 96% 96% 93%   CBG (last 3)   Recent Labs  10/21/13 1700 10/21/13 2119 10/22/13 0637  GLUCAP 86 146* 99    IV Fluid Intake:     MEDICATIONS  . citalopram  10 mg Oral Daily  . clopidogrel  75 mg Oral Q breakfast  . divalproex  250 mg Oral BID  . galantamine  12 mg Oral BID WC  . heparin  5,000 Units Subcutaneous 3 times per day  . insulin aspart  0-9 Units Subcutaneous TID WC  . memantine  10 mg Oral BID  . simvastatin  40 mg Oral QPM  . tamsulosin  0.4 mg Oral QHS  . vitamin B-12  100 mcg Oral Daily   PRN:  acetaminophen, haloperidol lactate, senna-docusate  Diet: Heart healthy diet with thin liquids Activity:  Up with assistance DVT Prophylaxis:  Subcutaneous heparin  CLINICALLY SIGNIFICANT STUDIES Basic Metabolic Panel:  Recent Labs Lab 10/20/13 1447 10/20/13 1456  NA 139 141  K 5.0 4.5  CL 102 101  CO2 25  --   GLUCOSE 98 103*  BUN 19 20  CREATININE 1.05 1.30  CALCIUM 8.4  --    Liver Function Tests:  Recent Labs Lab 10/20/13 1447  AST 31  ALT 19  ALKPHOS 51   BILITOT 0.3  PROT 6.3  ALBUMIN 3.2*   CBC:  Recent Labs Lab 10/20/13 1447 10/20/13 1456  WBC 5.1  --   NEUTROABS 2.4  --   HGB 14.7 15.3  HCT 43.7 45.0  MCV 95.2  --   PLT 126*  --    Coagulation:  Recent Labs Lab 10/20/13 1447  LABPROT 13.3  INR 1.03   Cardiac Enzymes: No results found for this basename: CKTOTAL, CKMB, CKMBINDEX, TROPONINI,  in the last 168 hours Urinalysis:  Recent Labs Lab 10/20/13 1913  COLORURINE YELLOW  LABSPEC 1.014  PHURINE 7.0  GLUCOSEU NEGATIVE  HGBUR NEGATIVE  BILIRUBINUR NEGATIVE  KETONESUR NEGATIVE  PROTEINUR NEGATIVE  UROBILINOGEN 0.2  NITRITE NEGATIVE  LEUKOCYTESUR NEGATIVE   Lipid Panel    Component Value Date/Time   CHOL 138 10/21/2013 0515   TRIG 80 10/21/2013 0515   HDL 44 10/21/2013 0515   CHOLHDL 3.1 10/21/2013 0515   VLDL 16 10/21/2013 0515   LDLCALC 78 10/21/2013 0515   HgbA1C  Lab Results  Component Value Date   HGBA1C 6.1* 10/21/2013    Urine Drug Screen:     Component  Value Date/Time   LABOPIA NONE DETECTED 10/20/2013 1913   COCAINSCRNUR NONE DETECTED 10/20/2013 1913   LABBENZ NONE DETECTED 10/20/2013 1913   AMPHETMU NONE DETECTED 10/20/2013 1913   THCU NONE DETECTED 10/20/2013 1913   LABBARB NONE DETECTED 10/20/2013 1913    Alcohol Level:  Recent Labs Lab 10/20/13 1447  ETH <11    Dg Chest 2 View 10/20/2013    Suboptimal inspiration. No acute cardiopulmonary disease. Chronic scar/atelectasis in the right middle lobe related to chronic eventration of the right anterior hemidiaphragm.      Ct Head Wo Contrast 10/20/2013    1. Negative for bleed or other acute intracranial process. 2. Old left basal ganglia/corona radiata and left occipital infarcts. 3. Atrophy and nonspecific white matter changes.   Mr Jodene Nam Head Wo Contrast 10/21/2013    1. Acute nonhemorrhagic infarcts within the left frontal and parietal lobe appear to be within a watershed distribution. 2. Additional acute nonhemorrhagic infarcts within the left  parietal and occipital lobe are likely watershed type infarcts as well. 3. Proximal occlusion of the left posterior cerebral artery. 4. Stable remote left MCA territory infarct within the left frontal operculum. 5. Medial left occipital lobe infarct and left thalamic infarcts are is new since the prior study but not acute. 6. Mild to moderate small vessel disease throughout the circle of Willis.    2D Echocardiogram   Ejection fraction 50-55% no cardiac source of emboli identified.  Carotid Doppler  Preliminary report: 1-39% ICA stenosis. Vertebral artery flow is antegrade.   CXR    EKG   Sinus bradycardia rate 58 beats per minute.For complete results please see formal report.   Therapy Recommendations inpatient rehabilitation recommended.  Physical Exam   Neurologic Examination:  Mental Status:  Alert, oriented, thought content appropriate. Speech fluent without evidence of aphasia. Able to follow 3 step commands without difficulty.  Cranial Nerves:  II: Discs flat bilaterally; Visual fields grossly normal, pupils equal, round, reactive to light and accommodation  III,IV, VI: ptosis not present, extra-ocular motions intact bilaterally  V,VII: smile symmetric, facial light touch sensation normal bilaterally  VIII: hearing normal bilaterally  IX,X: gag reflex present  XI: bilateral shoulder shrug  XII: midline tongue extension  Motor:  Right : Upper extremity 5/5 distally with 0/5 hand grip Left: Upper extremity 5/5  Lower extremity 5/5 Lower extremity 5/5  Tone and bulk:normal tone throughout; no atrophy noted  Sensory: Pinprick and light touch intact throughout, bilaterally  Deep Tendon Reflexes: 2+ in the upper extremities, 1+ knee jerks and absent AJ's bilaterally  Plantars:  Right: mute Left: mute  Cerebellar:  Dysmetria with finger-to-nose on the right  Gait: Unable to test  CV: pulses palpable throughout    ASSESSMENT Trevor Lutz is a 80 y.o. male presenting with  speech difficulties and right upper extremity weakness. TPA was not initiated as the patient's deficits rapidly improved. Infarct felt to be embolic of unknown etiology. On aspirin 325 mg orally every day prior to admission. Now on clopidogrel 75 mg orally every day for secondary stroke prevention. Patient with resultant right upper extremity weakness. Stroke work up underway.   Hyperlipidemia -  Zocor prior to admission - cholesterol 138 LDL 78  Hypertension  Alzheimer's dementia  Previous CVA  Include A1c 6.1   Hospital day # 2  TREATMENT/PLAN  Continue clopidogrel 75 mg orally every day for secondary stroke prevention.  Schedule TEE and loop recorder. Spoke with cardiology. They will try to schedule for  tomorrow. N.p.o. after midnight.  Therapist recommended inpatient rehabilitation    Mikey Bussing PA-C [facility] Pager [phone] 10/22/2013, 8:27 AM  I have personally obtained a history, examined the patient, evaluated imaging results, and formulated the assessment and plan of care. I agree with the above. Antony Contras, MD   To contact Stroke Continuity provider, please refer to [email or web address] After hours, contact General Neurology

## 2013-10-22 NOTE — Progress Notes (Signed)
  Echocardiogram 2D Echocardiogram has been performed.  [place] 10/22/2013, 12:13 PM

## 2013-10-22 NOTE — Evaluation (Signed)
Physical Therapy Evaluation Patient Details Name: Trevor Lutz MRN: [id number] DOB: 11-Sep-1921 Today's Date: 10/22/2013   History of Present Illness  80 y.o. male admitted to [facility] on 10/20/13 with  acute onset difficulty with speech and right upper extremity weakness. When symptoms did not improve EMS was called and the patient was brought in as a code stroke. MRI revealed acute left frontal, parietal, and occipital lobe infarcts.  Pt with significant PMHx of HTN, Alzheimer's dementia (with 24/7 caregivers mostly for supervision), and indwelling catheter due to BPH.    Clinical Impression  Pt with significant change from his normal baseline of playing tennis and walking independently.  He has 24/7 caregivers at baseline due to cognitive impairments related to Alzheimer's dementia.  He would benefit from intensive inpatient therapy due to needing mod assist to transfer and for short distance gait.  Right leg buckles and he has problems progressing it to take steps to walk.   PT to follow acutely for deficits listed below.       Follow Up Recommendations CIR    Equipment Recommendations  Wheelchair (measurements PT);Wheelchair cushion (measurements PT)    Recommendations for Other Services Rehab consult     Precautions / Restrictions Precautions Precautions: Fall Precaution Comments: right sided arm and leg weakness      Mobility   Transfers Overall transfer level: Needs assistance Equipment used: None Transfers: Sit to/from Stand Sit to Stand: Mod assist         General transfer comment: mod assist to support trunk to power up to standing from low recliner chair. Repeated multiple times for strength training, practice, and ulitately chair alarm pad placement.   Ambulation/Gait Ambulation/Gait assistance: Mod assist Ambulation Distance (Feet): 10 Feet (5' x 2) Assistive device: 1 person hand held assist Gait Pattern/deviations: Decreased step length - right;Decreased  dorsiflexion - right;Decreased weight shift to right;Shuffle (dragging right leg, soft knee)     General Gait Details: Pt needs mod assist to support trunk while attempting short distance gait.  Pt has poor progressing of right leg and tends to have a flexion moment with buckling in stance on right.  support of trunk needed for balance and to prevent fall as he puts his weight on his right leg while walking.  It would be safer and therapist could help control right knee more with second person's assist.      Modified Rankin (Stroke Patients Only) Modified Rankin (Stroke Patients Only) Pre-Morbid Rankin Score: No symptoms Modified Rankin: Moderately severe disability     Balance Overall balance assessment: Needs assistance Sitting-balance support: Feet supported;No upper extremity supported Sitting balance-Leahy Scale: Good Sitting balance - Comments: supervision while pt pulling up socks by bending forward at trunk seated EOB.  Extra time needed to complete task, but more due to right hand weakness than sitting balance deficits.    Standing balance support: Single extremity supported Standing balance-Leahy Scale: Poor Standing balance comment: needs support in static and dynamic tasks in standing.                              Pertinent Vitals/Pain See vitals flow sheet.     Home Living Family/patient expects to be discharged to:: Private residence Living Arrangements: Spouse/significant other;Non-relatives/Friends (24 hour caregivers) Available Help at Discharge: Family;Personal care attendant;Available 24 hours/day Type of Home: House Home Access: Level entry   Entrance Stairs-Number of Steps: 0 Home Layout: Two level   Additional Comments:  Caregiver present states that there is not a BR on main level, but that the pool house is one level and pt could potentially stay out there (per OT report).     Prior Function Level of Independence: Independent          Comments: Pt played tennis weekly.  He was very active in the community.  He has 24 hour hired caregivers due to cognitive deficits.  No h/o falls.  Does not drive.  Also likes to play golf.      Hand Dominance   Dominant Hand: Right    Extremity/Trunk Assessment   Upper Extremity Assessment: Defer to OT evaluation           Lower Extremity Assessment: RLE deficits/detail RLE Deficits / Details: right leg with functional deficits and deficits when MMT seated.  Ankle strength 3-/5, knee extension 3-/5, hip flexion 3-/5.  Pt with flexed knee in standing  (stance) and difficulty with foot clearace and leg progression while stepping forward during swing phase of gait.      Cervical / Trunk Assessment: Normal  Communication   Communication: No difficulties  Cognition Arousal/Alertness: Awake/alert Behavior During Therapy: WFL for tasks assessed/performed Overall Cognitive Status: No family/caregiver present to determine baseline cognitive functioning (baseline dementia)       Memory: Decreased short-term memory                       Assessment/Plan    PT Assessment Patient needs continued PT services  PT Diagnosis Difficulty walking;Abnormality of gait;Generalized weakness;Acute pain;Hemiplegia dominant side   PT Problem List Decreased strength;Decreased activity tolerance;Decreased balance;Decreased mobility;Decreased coordination;Decreased cognition;Decreased knowledge of use of DME;Decreased knowledge of precautions;Decreased safety awareness  PT Treatment Interventions DME instruction;Gait training;Stair training;Functional mobility training;Therapeutic activities;Therapeutic exercise;Balance training;Neuromuscular re-education;Cognitive remediation;Patient/family education   PT Goals (Current goals can be found in the Care Plan section) Acute Rehab PT Goals Patient Stated Goal: to figure out why his hand is so weak PT Goal Formulation: Patient unable to  participate in goal setting (spoke with one of his caregivers) Time For Goal Achievement: 11/05/13 Potential to Achieve Goals: Good    Frequency Min 4X/week    End of Session Equipment Utilized During Treatment: Gait belt Activity Tolerance: Patient tolerated treatment well Patient left: in chair;with call bell/phone within reach;with chair alarm set           Time: 0973-5329 PT Time Calculation (min): 20 min   Charges:   PT Evaluation $Initial PT Evaluation Tier I: 1 Procedure PT Treatments $Therapeutic Activity: 8-22 mins        Burch [place] B. Esk[place], [place], DPT [phone]   10/22/2013, 4:10 PM

## 2013-10-22 NOTE — Progress Notes (Signed)
PT Cancellation Note  Patient Details Name: [name] MRN: [id number] DOB: 1921/12/22   Cancelled Treatment:    Reason Eval/Treat Not Completed: Patient at procedure or test/unavailable.  PT at testing, out of room.  PT to check back later as time allows.    Thanks,    Barbarann Ehlers. [address], [place], DPT [phone]   10/22/2013, 12:19 PM

## 2013-10-22 NOTE — Evaluation (Signed)
Occupational Therapy Evaluation Patient Details Name: Trevor Lutz MRN: [id number] DOB: 15-Aug-1921 Today's Date: 10/22/2013    History of Present Illness 80 y.o. male admitted to [facility] on 10/20/13 with  acute onset difficulty with speech and right upper extremity weakness. When symptoms did not improve EMS was called and the patient was brought in as a code stroke. MRI revealed acute left frontal, parietal, and occipital lobe infarcts.  Pt with significant PMHx of HTN, Alzheimer's dementia (with 24/7 caregivers mostly for supervision), and indwelling catheter due to BPH.     Clinical Impression   Pt admitted with above. He demonstrates the below listed deficits and will benefit from continued OT to maximize safety and independence with BADLs.  Pt presents to OT with h/o dementia; Rt. Hemiplegia; visual deficits.  Currently he requires mod A for BADLs.  He has 24 hired assistance at discharge and was very active in the community, including playing tennis weekly PTA.  Feel he would benefit from CIR.      Follow Up Recommendations  CIR;Supervision/Assistance - 24 hour    Equipment Recommendations  Tub/shower seat    Recommendations for Other Services Rehab consult     Precautions / Restrictions Precautions Precautions: Fall Precaution Comments: right sided arm and leg weakness      Mobility Bed Mobility Overal bed mobility: Needs Assistance Bed Mobility: Supine to Sit     Supine to sit: Min assist     General bed mobility comments: assist to move LEs to EOB and assist to scoot fully to EOB  Transfers Overall transfer level: Needs assistance Equipment used: None Transfers: Sit to/from Stand Sit to Stand: Mod assist Stand pivot transfers: Mod assist       General transfer comment: mod assist to support trunk to power up to standing from low recliner chair. Repeated multiple times for strength training, practice, and ulitately chair alarm pad placement.     Balance Overall  balance assessment: Needs assistance Sitting-balance support: Feet supported;No upper extremity supported Sitting balance-Leahy Scale: Good Sitting balance - Comments: supervision while pt pulling up socks by bending forward at trunk seated EOB.  Extra time needed to complete task, but more due to right hand weakness than sitting balance deficits.    Standing balance support: Single extremity supported Standing balance-Leahy Scale: Poor Standing balance comment: needs support in static and dynamic tasks in standing.                             ADL Overall ADL's : Needs assistance/impaired Eating/Feeding: Set up;Sitting   Grooming: Wash/dry hands;Wash/dry face;Oral care;Minimal assistance;Sitting   Upper Body Bathing: Moderate assistance;Sitting   Lower Body Bathing: Maximal assistance;Sit to/from stand   Upper Body Dressing : Moderate assistance;Sitting   Lower Body Dressing: Maximal assistance;Sit to/from stand Lower Body Dressing Details (indicate cue type and reason): Pt able to doff socks, but unable to don due to DOE 3/4 Toilet Transfer: Moderate assistance Toilet Transfer Details (indicate cue type and reason): cues to block Rt knee to prevent buckling.  Cues for hand placement and sequencing  Toileting- Clothing Manipulation and Hygiene: Maximal assistance;Sit to/from stand       Functional mobility during ADLs: Moderate assistance General ADL Comments: Pt consistenly attempt to incorporate Rt UE into activities     Vision Eye Alignment: Within Functional Limits   Ocular Range of Motion: Within Functional Limits Tracking/Visual Pursuits: Able to track stimulus in all quads without difficulty  Additional Comments: Rt inferior field deficit, pt easily distracts so difficult to determine accuracy of confrontation testing.  Pt attempted to read newspaper - makes multiple error.  He oftentimes missed words in middle of sentence and would reverse order of  words.   Will need to continue to asses fields and unsure if reversal of words due to language deficits or due to vision deficits     Perception     Praxis Praxis Praxis tested?: Within functional limits    Pertinent Vitals/Pain No complaint pain      Hand Dominance Right   Extremity/Trunk Assessment Upper Extremity Assessment Upper Extremity Assessment: Defer to OT evaluation RUE Deficits / Details: Pt demonstrates shoulder flexion grossly 2/5; elbow flexion 2/5; elbow ext ~2-/5; 0/5 fingers RUE Sensation: decreased light touch RUE Coordination: decreased fine motor;decreased gross motor   Lower Extremity Assessment Lower Extremity Assessment: RLE deficits/detail RLE Deficits / Details: right leg with functional deficits and deficits when MMT seated.  Ankle strength 3-/5, knee extension 3-/5, hip flexion 3-/5.  Pt with flexed knee in standing  (stance) and difficulty with foot clearace and leg progression while stepping forward during swing phase of gait.   RLE Sensation:  (WFL per pt report)   Cervical / Trunk Assessment Cervical / Trunk Assessment: Normal   Communication Communication Communication: No difficulties   Cognition Arousal/Alertness: Awake/alert Behavior During Therapy: WFL for tasks assessed/performed Overall Cognitive Status: No family/caregiver present to determine baseline cognitive functioning (baseline dementia) Area of Impairment: Memory;Awareness;Problem solving;Attention   Current Attention Level: Selective Memory: Decreased short-term memory     Awareness: Intellectual Problem Solving: Requires verbal cues;Requires tactile cues General Comments: Pt able to state that his Rt side is weak, but despite multiple explanations, unable to carry over info.   General Comments       Exercises       Shoulder Instructions      Home Living Family/patient expects to be discharged to:: Private residence Living Arrangements: Spouse/significant  other;Non-relatives/Friends (24 hour caregivers) Available Help at Discharge: Family;Personal care attendant;Available 24 hours/day Type of Home: House Home Access: Level entry Entrance Stairs-Number of Steps: 0   Home Layout: Two level Alternate Level Stairs-Number of Steps: flight (7 steps, landing, 7 steps) Alternate Level Stairs-Rails: Left Bathroom Shower/Tub: Occupational psychologist: Handicapped height         Additional Comments: Caregiver present states that there is not a BR on main level, but that the pool house is one level and pt could potentially stay out there (per OT report).   Lives With: Spouse;Other (Comment) (hired caregivers)    Prior Functioning/Environment Level of Independence: Independent        Comments: Pt played tennis weekly.  He was very active in the community.  He has 24 hour hired caregivers due to cognitive deficits.  No h/o falls.  Does not drive.  Also likes to play golf.     OT Diagnosis: Generalized weakness;Cognitive deficits;Disturbance of vision;Hemiplegia dominant side   OT Problem List: Decreased strength;Decreased range of motion;Impaired balance (sitting and/or standing);Impaired vision/perception;Decreased coordination;Decreased cognition;Decreased safety awareness;Decreased knowledge of use of DME or AE;Impaired sensation;Impaired tone;Impaired UE functional use   OT Treatment/Interventions: Self-care/ADL training;Neuromuscular education;DME and/or AE instruction;Therapeutic activities;Cognitive remediation/compensation;Visual/perceptual remediation/compensation;Patient/family education;Balance training    OT Goals(Current goals can be found in the care plan section) Acute Rehab OT Goals Patient Stated Goal: to figure out why his hand is so weak OT Goal Formulation: With patient Time For Goal Achievement: 11/05/13 Potential  to Achieve Goals: Good ADL Goals Pt Will Perform Grooming: with supervision;sitting Pt Will Perform  Upper Body Bathing: with supervision;sitting Pt Will Perform Lower Body Bathing: with min assist;sit to/from stand Pt Will Transfer to Toilet: with min assist;ambulating;bedside commode;grab bars;regular height toilet Pt Will Perform Toileting - Clothing Manipulation and hygiene: with min assist;sit to/from stand Additional ADL Goal #1: Pt will use Rt. UE as a gross assist.   OT Frequency: Min 3X/week   Barriers to D/C:            Co-evaluation              End of Session Nurse Communication: Mobility status  Activity Tolerance: Patient tolerated treatment well Patient left: in chair;with call bell/phone within reach;with family/visitor present   Time: 4403-4742 OT Time Calculation (min): 42 min Charges:  OT General Charges $OT Visit: 1 Procedure OT Evaluation $Initial OT Evaluation Tier I: 1 Procedure OT Treatments $Self Care/Home Management : 23-37 mins G-Codes:    Kollyn Lingafelter M Sharalyn Lomba 11/16/13, 4:34 PM

## 2013-10-23 ENCOUNTER — Encounter (HOSPITAL_COMMUNITY)
Admission: EM | Disposition: A | Discharge status: Skilled Nursing Facility | Payer: Self-pay | Source: Home / Self Care | Attending: Internal Medicine

## 2013-10-23 ENCOUNTER — Encounter (HOSPITAL_COMMUNITY): Payer: Self-pay | Admitting: Gastroenterology

## 2013-10-23 DIAGNOSIS — I059 Rheumatic mitral valve disease, unspecified: Secondary | ICD-10-CM

## 2013-10-23 HISTORY — PX: TEE WITHOUT CARDIOVERSION: SHX5443

## 2013-10-23 LAB — GLUCOSE, CAPILLARY
Glucose-Capillary: 91 mg/dL (ref 70–99)
Glucose-Capillary: 100 mg/dL — ABNORMAL HIGH (ref 70–99)
Glucose-Capillary: 94 mg/dL (ref 70–99)

## 2013-10-23 SURGERY — ECHOCARDIOGRAM, TRANSESOPHAGEAL
Anesthesia: Moderate Sedation

## 2013-10-23 MED ORDER — MIDAZOLAM HCL 5 MG/ML IJ SOLN
INTRAMUSCULAR | Status: AC
  Filled 2013-10-23: qty 2

## 2013-10-23 MED ORDER — BUTAMBEN-TETRACAINE-BENZOCAINE 2-2-14 % EX AERO
INHALATION_SPRAY | CUTANEOUS | Status: DC | PRN
  Administered 2013-10-23: 2 via TOPICAL

## 2013-10-23 MED ORDER — MIDAZOLAM HCL 10 MG/2ML IJ SOLN
INTRAMUSCULAR | Status: DC | PRN
  Administered 2013-10-23 (×2): 2 mg via INTRAVENOUS

## 2013-10-23 MED ORDER — FENTANYL CITRATE 0.05 MG/ML IJ SOLN
INTRAMUSCULAR | Status: DC | PRN
  Administered 2013-10-23 (×2): 25 ug via INTRAVENOUS

## 2013-10-23 MED ORDER — SODIUM CHLORIDE 0.9 % IV SOLN
INTRAVENOUS | Status: DC
  Administered 2013-10-23: 500 mL via INTRAVENOUS

## 2013-10-23 MED ORDER — FENTANYL CITRATE 0.05 MG/ML IJ SOLN
INTRAMUSCULAR | Status: AC
  Filled 2013-10-23: qty 2

## 2013-10-23 MED ORDER — DIPHENHYDRAMINE HCL 50 MG/ML IJ SOLN
INTRAMUSCULAR | Status: AC
  Filled 2013-10-23: qty 1

## 2013-10-23 MED ORDER — SODIUM CHLORIDE 0.9 % IV SOLN
INTRAVENOUS | Status: DC
  Administered 2013-10-23: 18:00:00 via INTRAVENOUS

## 2013-10-23 NOTE — Progress Notes (Signed)
Physical medicine and rehabilitation consult requested chart reviewed. Please refer to admission coordinators note in reference to patient being [facility] and it is highly unlikely that this patient would be approved for inpatient rehabilitation services. Recommendations at this time are for skilled nursing facility. Case manager to followup

## 2013-10-23 NOTE — Progress Notes (Signed)
Patient was retaining urine. Doctor advised to scan bladder and insert catheter if more than 250 mL was in bladder. There was 455 ML in bladder when scanned. Catheter was inserted and 400 mL was returned after insertion. Patient tolerated the procedure well. Burnell Blanks, RN

## 2013-10-23 NOTE — Progress Notes (Signed)
PT Cancellation Note  Patient Details Name: Trevor Lutz MRN: [id number] DOB: [birth year]   Cancelled Treatment:    Reason Eval/Treat Not Completed: Patient at procedure or test/unavailable.  PT to check back tomorrow.    Thanks,    Barbarann Ehlers. [place], [place], DPT [phone]   10/23/2013, 4:32 PM

## 2013-10-23 NOTE — Progress Notes (Addendum)
                                            Patient Demographics  Trevor Lutz, is a 80 y.o. male, DOB - 04/30/1922, MRN:[id number]  Admit date - 10/20/2013   Admitting Physician Abraham Feliz Ortiz, MD  Outpatient Primary MD for the patient is [name],[name], MD  LOS - 3   Chief Complaint  Patient presents with  . Code Stroke        Assessment & Plan    1. 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 He has had previous history of CVAs, was on aspirin and switched to Plavix by neurology, seen by PT-OT & speech, noted MRI MRA brain, stable TTE and carotid duplex.   Will  need TEE on 10-23-13    Lab Results  Component Value Date   HGBA1C 6.1* 10/21/2013    Lab Results  Component Value Date   CHOL 138 10/21/2013   HDL 44 10/21/2013   LDLCALC 78 10/21/2013   TRIG 80 10/21/2013   CHOLHDL 3.1 10/21/2013     2. Underlying Alzheimer's dementia. At risk for delirium, we'll continue supportive care with Depakote along with Namenda.    3. BPH. Continue Flomax. Developed urinary retention on 10/23/2013, if continues to retain Urine Will place Foley.    4. Dyslipidemia. On home dose statin. Lipid panel stable.    5. HTN. Allowing for permissive hypertension. As needed IV hydralazine.       Code Status: DNR now  Family Communication:    Updated wife Bonnie and brother-in-law who is a physician on 10/21/2013 @ 6 PM.   Called and left message for wife Bonnie on 10/23/2013 11 AM  Talked to Bonnie 10-23-13 @ 12.15 pm her Cell [phone] (she was in the rm)    Disposition Plan: SNF   Procedures CT head, MRI brain, MRA head, echo, carotid duplex, TEE   Consults  Neuro   Medications  Scheduled Meds: . citalopram  10 mg Oral Daily  . clopidogrel  75 mg Oral Q breakfast  .  divalproex  250 mg Oral BID  . galantamine  12 mg Oral BID WC  . heparin  5,000 Units Subcutaneous 3 times per day  . insulin aspart  0-9 Units Subcutaneous TID WC  . memantine  10 mg Oral BID  . simvastatin  40 mg Oral QPM  . tamsulosin  0.4 mg Oral QHS  . vitamin B-12  100 mcg Oral Daily   Continuous Infusions: . sodium chloride     PRN Meds:.acetaminophen, haloperidol lactate, senna-docusate  DVT Prophylaxis   heparin  Lab Results  Component Value Date   PLT 126* 10/20/2013    Antibiotics     Anti-infectives   None          Subjective:   Claire Embleton today has, No headache, No chest pain, No abdominal pain - No Nausea, No new weakness tingling or numbness, No Cough - SOB.   Objective:   Filed Vitals:   10/22/13 1855 10/22/13 2225 10/23/13 0238 10/23/13 0545  BP: 131/69 152/68 117/60 133/63  Pulse: 72 63 67 60  Temp: 98.1 F (36.7 C) 98.1 F (36.7 C) 97.6 F (36.4 C) 97.5 F (36.4 C)  TempSrc: Oral Oral Oral Oral  Resp: 18 18 18 18  Height:      Weight:        SpO2: 95% 95% 93% 95%    Wt Readings from Last 3 Encounters:  10/20/13 74.702 kg (164 lb 11 oz)  10/20/13 74.702 kg (164 lb 11 oz)  11/13/12 73.392 kg (161 lb 12.8 oz)     Intake/Output Summary (Last 24 hours) at 10/23/13 1105 Last data filed at 10/22/13 1342  Gross per 24 hour  Intake      0 ml  Output    150 ml  Net   -150 ml     Physical Exam  Awake , not Alert, Oriented X 0,R side 4/5, Exp aphasia, mildly agitated affect [place].AT,PERRAL Supple Neck,No JVD, No cervical lymphadenopathy appriciated.  Symmetrical Chest wall movement, Good air movement bilaterally, CTAB RRR,No Gallops,Rubs or new Murmurs, No Parasternal Heave +ve B.Sounds, Abd Soft, Non tender, No organomegaly appriciated, No rebound - guarding or rigidity. Foley No Cyanosis, Clubbing or edema, No new Rash or bruise      Data Review   Micro Results No results found for this or any previous visit (from the past 240  hour(s)).  Radiology Reports  Carotids  Bilateral: mild calcific plaque origin ICA. 1-39% ICA stenosis. Vertebral artery flow is antegrade.   TTE  Left ventricle: The cavity size was normal. Wall thickness was increased in a pattern of mild LVH. Systolic function was normal. The estimated ejection fraction was in the range of 50% to 55%. Wall motion was normal; there were no regional wall motion abnormalities. Doppler parameters are consistent with abnormal left ventricular relaxation (grade 1 diastolic dysfunction).    TEE      Dg Chest 2 View  10/20/2013   CLINICAL DATA:  Stroke.  Current history of hypertension.  EXAM: CHEST  2 VIEW  COMPARISON:  DG CHEST 2 VIEW dated 11/10/2012; DG CHEST 2 VIEW dated 02/27/2004.  FINDINGS: Suboptimal inspiration accounts for crowded bronchovascular markings, especially in the bases, and accentuates the cardiac silhouette. Taking this into account, cardiac silhouette upper normal in size and stable. Thoracic aorta mildly atherosclerotic. Hilar and mediastinal contours otherwise unremarkable. 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 Chronic linear scar/atelectasis in the right middle lobe, unchanged. Lungs otherwise clear. No localized airspace consolidation. No pleural effusions. No pneumothorax. Normal pulmonary vascularity. Degenerative changes involving the thoracic spine.  IMPRESSION: Suboptimal inspiration. No acute cardiopulmonary disease. Chronic scar/atelectasis in the right middle lobe related to chronic eventration of the right anterior hemidiaphragm.   Electronically Signed   By: Evangeline Dakin M.D.   On: 10/20/2013 21:15   Ct Head Wo Contrast  10/20/2013   CLINICAL DATA:  Code stroke, right arm weakness  EXAM: CT HEAD WITHOUT CONTRAST  TECHNIQUE: Contiguous axial images were obtained from the base of the skull through the vertex without intravenous contrast.  COMPARISON:  MR  11/17/2010  FINDINGS: Retention cyst or polyp in the right frontal sinus. Atherosclerotic and physiologic intracranial calcifications. Large lacunar infarct in the left basal ganglia and corona radiata with adjacent hypoattenuation in the adjacent white matter and some mild ex vacuo dilatation of the adjacent lateral ventricle. There is a medial left occipital lobe subacute or chronic infarct which was not conspicuous on the prior study. No acute intracranial hemorrhage, midline shift, mass, or mass effect. Moderate diffuse parenchymal atrophy. Mild patchy areas of hypoattenuation in deep and periventricular white matter bilaterally.  IMPRESSION: 1. Negative for bleed or other acute intracranial process. 2. Old left basal ganglia/corona radiata and left occipital infarcts. 3. Atrophy and nonspecific white  matter changes. Critical Value/emergent results were called by telephone at the time of interpretation on 10/20/2013 at 2:49 PM to Dr. Reynolds, who verbally acknowledged these results.   Electronically Signed   By: Daniel  Hassell M.D.   On: 10/20/2013 14:49    CBC  Recent Labs Lab 10/20/13 1447 10/20/13 1456  WBC 5.1  --   HGB 14.7 15.3  HCT 43.7 45.0  PLT 126*  --   MCV 95.2  --   MCH 32.0  --   MCHC 33.6  --   RDW 14.4  --   LYMPHSABS 1.7  --   MONOABS 0.7  --   EOSABS 0.3  --   BASOSABS 0.0  --     Chemistries   Recent Labs Lab 10/20/13 1447 10/20/13 1456  NA 139 141  K 5.0 4.5  CL 102 101  CO2 25  --   GLUCOSE 98 103*  BUN 19 20  CREATININE 1.05 1.30  CALCIUM 8.4  --   AST 31  --   ALT 19  --   ALKPHOS 51  --   BILITOT 0.3  --    ------------------------------------------------------------------------------------------------------------------ estimated creatinine clearance is 36 ml/min (by C-G formula based on Cr of 1.3). ------------------------------------------------------------------------------------------------------------------  Recent Labs  10/21/13 0515    HGBA1C 6.1*   ------------------------------------------------------------------------------------------------------------------  Recent Labs  10/21/13 0515  CHOL 138  HDL 44  LDLCALC 78  TRIG 80  CHOLHDL 3.1   ------------------------------------------------------------------------------------------------------------------ No results found for this basename: TSH, T4TOTAL, FREET3, T3FREE, THYROIDAB,  in the last 72 hours ------------------------------------------------------------------------------------------------------------------ No results found for this basename: VITAMINB12, FOLATE, FERRITIN, TIBC, IRON, RETICCTPCT,  in the last 72 hours  Coagulation profile  Recent Labs Lab 10/20/13 1447  INR 1.03    No results found for this basename: DDIMER,  in the last 72 hours  Cardiac Enzymes No results found for this basename: CK, CKMB, TROPONINI, MYOGLOBIN,  in the last 168 hours ------------------------------------------------------------------------------------------------------------------ No components found with this basename: POCBNP,      Time Spent in minutes   35   Burnham Trost K Masyn Rostro M.D on 10/23/2013 at 11:05 AM  Between 7am to 7pm - Pager - [phone]  After 7pm go to [email or web address] - password [email or web address]  And look for the night coverage person covering for me after hours  [facility] Office  [phone]  

## 2013-10-23 NOTE — Progress Notes (Signed)
UR completed. Lacresha Fusilier RN CCM Case Mgmt phone [phone] 

## 2013-10-23 NOTE — Progress Notes (Signed)
Rehab Admissions Coordinator Note:  Patient was screened by Retta Diones for appropriateness for an Inpatient Acute Rehab Consult.  At this time, we are recommending [facility].  Patient has [facility] and it is very unlikely that we could get approval for inpatient rehab admission.  Therefore, expect patient will need SNF placement.  Evalee Mutton Delesha Pohlman 10/23/2013, 8:25 AM  I can be reached at [phone].

## 2013-10-23 NOTE — Progress Notes (Signed)
Clinical Social Work Department BRIEF PSYCHOSOCIAL ASSESSMENT 10/23/2013  Patient:  Trevor Lutz, Trevor Lutz     Account Number:  [id number]     Admit date:  10/20/2013  Clinical Social Worker:  Valda Lamb  Date/Time:  10/23/2013 01:32 PM  Referred by:  Physician  Date Referred:  10/23/2013 Referred for  SNF Placement   Other Referral:   Interview type:  Family Other interview type:   CSW completed assessment with pt wife Horris Latino [phone]    PSYCHOSOCIAL DATA Living Status:  WIFE Admitted from facility:   Level of care:   Primary support name:  Herby Abraham Primary support relationship to patient:  SPOUSE Degree of support available:   Pt wife states that pt receives 24 hr care around the clock from [facility] Current Concerns  Post-Acute Placement   Other Concerns:    SOCIAL WORK ASSESSMENT / PLAN CSW received a call from pt RN requesting for CSW to speak to the wife by phone.    CSW introduced herself and role. Pt wife stated that pt lives with her and received 24hr around the clock care from [place] however wife requesting SNF placement before pt return home. Wife agreeable to SNF search and CSW to follow up with bed offers.   Assessment/plan status:  Psychosocial Support/Ongoing Assessment of Needs Other assessment/ plan:   Information/referral to community resources:   SNF list emailed to wife.    PATIENT'S/FAMILY'S RESPONSE TO PLAN OF CARE: Pt presents with dementia and unable to complete assessment with CSW. Assessment completed with wife Horris Latino who is agreeable to SNF placement.    CSW to follow up with bed offers.       Hunt Oris, MSW, [place]

## 2013-10-23 NOTE — Progress Notes (Signed)
Stroke Team Progress Note  HISTORY Trevor Lutz is an 80 y.o. male who was with his caregiver today. She noted acute onset difficulty with speech and right upper extremity weakness. When symptoms did not improve EMS was called and the patient was brought in as a code stroke. Initial NIHSS of 1.  Patient with history of stroke in the past.  Date last known well: Date: 10/20/2013  Time last known well: Time: 13:35  tPA Given: No: Improvement in symptoms, low NIHSS    SUBJECTIVE The patient remains confused. Family member present today.  OBJECTIVE Most recent Vital Signs: Filed Vitals:   10/23/13 1545 10/23/13 1550 10/23/13 1555 10/23/13 1600  BP: 177/102 178/104 176/95 160/77  Pulse: 76 67 62 59  Temp:    97.7 F (36.5 C)  TempSrc:      Resp: 23 16 19 16   Height:      Weight:      SpO2: 98% 97% 98% 100%   CBG (last 3)   Recent Labs  10/22/13 2224 10/23/13 0634 10/23/13 1148  GLUCAP 96 91 100*    IV Fluid Intake:   . sodium chloride 500 mL (10/23/13 1453)  . sodium chloride      MEDICATIONS  . [facility] HOLD] citalopram  10 mg Oral Daily  . [facility] HOLD] clopidogrel  75 mg Oral Q breakfast  . [MAR HOLD] divalproex  250 mg Oral BID  . [facility] HOLD] galantamine  12 mg Oral BID WC  . [MAR HOLD] heparin  5,000 Units Subcutaneous 3 times per day  . [MAR HOLD] insulin aspart  0-9 Units Subcutaneous TID WC  . [MAR HOLD] memantine  10 mg Oral BID  . [facility] HOLD] simvastatin  40 mg Oral QPM  . [MAR HOLD] tamsulosin  0.4 mg Oral QHS  . [MAR HOLD] vitamin B-12  100 mcg Oral Daily   PRN:  [MAR HOLD] acetaminophen, [MAR HOLD] haloperidol lactate, [MAR HOLD] senna-docusate  Diet: Heart healthy diet with thin liquids Activity:  Up with assistance DVT Prophylaxis:  Subcutaneous heparin  CLINICALLY SIGNIFICANT STUDIES Basic Metabolic Panel:   Recent Labs Lab 10/20/13 1447 10/20/13 1456  NA 139 141  K 5.0 4.5  CL 102 101  CO2 25  --   GLUCOSE 98 103*  BUN 19 20  CREATININE  1.05 1.30  CALCIUM 8.4  --    Liver Function Tests:   Recent Labs Lab 10/20/13 1447  AST 31  ALT 19  ALKPHOS 51  BILITOT 0.3  PROT 6.3  ALBUMIN 3.2*   CBC:   Recent Labs Lab 10/20/13 1447 10/20/13 1456  WBC 5.1  --   NEUTROABS 2.4  --   HGB 14.7 15.3  HCT 43.7 45.0  MCV 95.2  --   PLT 126*  --    Coagulation:   Recent Labs Lab 10/20/13 1447  LABPROT 13.3  INR 1.03   Cardiac Enzymes: No results found for this basename: CKTOTAL, CKMB, CKMBINDEX, TROPONINI,  in the last 168 hours Urinalysis:   Recent Labs Lab 10/20/13 1913  COLORURINE YELLOW  LABSPEC 1.014  PHURINE 7.0  GLUCOSEU NEGATIVE  HGBUR NEGATIVE  BILIRUBINUR NEGATIVE  KETONESUR NEGATIVE  PROTEINUR NEGATIVE  UROBILINOGEN 0.2  NITRITE NEGATIVE  LEUKOCYTESUR NEGATIVE   Lipid Panel    Component Value Date/Time   CHOL 138 10/21/2013 0515   TRIG 80 10/21/2013 0515   HDL 44 10/21/2013 0515   CHOLHDL 3.1 10/21/2013 0515   VLDL 16 10/21/2013 0515   LDLCALC  78 10/21/2013 0515   HgbA1C  Lab Results  Component Value Date   HGBA1C 6.1* 10/21/2013    Urine Drug Screen:     Component Value Date/Time   LABOPIA NONE DETECTED 10/20/2013 1913   COCAINSCRNUR NONE DETECTED 10/20/2013 1913   LABBENZ NONE DETECTED 10/20/2013 1913   AMPHETMU NONE DETECTED 10/20/2013 1913   THCU NONE DETECTED 10/20/2013 1913   LABBARB NONE DETECTED 10/20/2013 1913    Alcohol Level:   Recent Labs Lab 10/20/13 1447  ETH <11    Dg Chest 2 View 10/20/2013    Suboptimal inspiration. No acute cardiopulmonary disease. Chronic scar/atelectasis in the right middle lobe related to chronic eventration of the right anterior hemidiaphragm.      Ct Head Wo Contrast 10/20/2013    1. Negative for bleed or other acute intracranial process. 2. Old left basal ganglia/corona radiata and left occipital infarcts. 3. Atrophy and nonspecific white matter changes.   Mr Trevor Lutz Head Wo Contrast 10/21/2013    1. Acute nonhemorrhagic infarcts within the left  frontal and parietal lobe appear to be within a watershed distribution. 2. Additional acute nonhemorrhagic infarcts within the left parietal and occipital lobe are likely watershed type infarcts as well. 3. Proximal occlusion of the left posterior cerebral artery. 4. Stable remote left MCA territory infarct within the left frontal operculum. 5. Medial left occipital lobe infarct and left thalamic infarcts are is new since the prior study but not acute. 6. Mild to moderate small vessel disease throughout the circle of Willis.    2D Echocardiogram   Ejection fraction 50-55% no cardiac source of emboli identified.  Carotid Doppler  Preliminary report: 1-39% ICA stenosis. Vertebral artery flow is antegrade.   CXR    EKG   Sinus bradycardia rate 58 beats per minute.For complete results please see formal report.   Therapy Recommendations inpatient rehabilitation recommended.  Physical Exam   Neurologic Examination:  Mental Status:  Alert, oriented, thought content appropriate. Speech fluent without evidence of aphasia. Able to follow 3 step commands without difficulty.  Cranial Nerves:  II: Discs flat bilaterally; Visual fields grossly normal, pupils equal, round, reactive to light and accommodation  III,IV, VI: ptosis not present, extra-ocular motions intact bilaterally  V,VII: smile symmetric, facial light touch sensation normal bilaterally  VIII: hearing normal bilaterally  IX,X: gag reflex present  XI: bilateral shoulder shrug  XII: midline tongue extension  Motor:  Right : Upper extremity 5/5 distally with 0/5 hand grip Left: Upper extremity 5/5  Lower extremity 5/5 Lower extremity 5/5  Tone and bulk:normal tone throughout; no atrophy noted  Sensory: Pinprick and light touch intact throughout, bilaterally  Deep Tendon Reflexes: 2+ in the upper extremities, 1+ knee jerks and absent AJ's bilaterally  Plantars:  Right: mute Left: mute  Cerebellar:  Dysmetria with finger-to-nose on the  right  Gait: Unable to test  CV: pulses palpable throughout    ASSESSMENT [name] is a 80 y.o. male presenting with speech difficulties and right upper extremity weakness. TPA was not initiated as the patient's deficits rapidly improved. Infarct felt to be embolic of unknown etiology. On aspirin 325 mg orally every day prior to admission. Now on clopidogrel 75 mg orally every day for secondary stroke prevention. Patient with resultant right upper extremity weakness. Stroke work up underway.   Hyperlipidemia -  Zocor prior to admission - cholesterol 138 LDL 78  Hypertension  Alzheimer's dementia  Previous CVA  Include A1c 6.1   Hospital day #  3  TREATMENT/PLAN  Continue clopidogrel 75 mg orally every day for secondary stroke prevention.  TEE - no thrombi no PFO  Therapist recommended inpatient rehabilitation. Rehabilitation M.D. consult ordered.  Stroke team will sign off. Please call for further assistance.  Followup Dr. Leonie Man in 2 months.    Mikey Bussing PA-C [facility] Pager [phone] 10/23/2013, 4:26 PM  I have personally obtained a history, examined the patient, evaluated imaging results, and formulated the assessment and plan of care. I agree with the above.  Antony Contras, MD   To contact Stroke Continuity provider, please refer to [email or web address] After hours, contact General Neurology

## 2013-10-23 NOTE — Progress Notes (Addendum)
Occupational Therapy Treatment Patient Details Name: Trevor Lutz MRN: [id number] DOB: 06-21-1921 Today's Date: 10/23/2013    History of present illness 80 y.o. male admitted to [facility] on 10/20/13 with  acute onset difficulty with speech and right upper extremity weakness. When symptoms did not improve EMS was called and the patient was brought in as a code stroke. MRI revealed acute left frontal, parietal, and occipital lobe infarcts.  Pt with significant PMHx of HTN, Alzheimer's dementia (with 24/7 caregivers mostly for supervision), and indwelling catheter due to BPH.     OT comments  Pt progressing. Continue to recommend CIR and feel pt is great candidate.   Follow Up Recommendations  CIR;Supervision/Assistance - 24 hour    Equipment Recommendations  Tub/shower seat    Recommendations for Other Services Rehab consult    Precautions / Restrictions Precautions Precautions: Fall Precaution Comments: right sided arm and leg weakness       Mobility Bed Mobility Overal bed mobility: Needs Assistance Bed Mobility: Supine to Sit     Supine to sit: Min assist     General bed mobility comments: Min A for RUE.  Transfers Overall transfer level: Needs assistance   Transfers: Sit to/from Stand Sit to Stand: Mod assist         General transfer comment: cues for technique. Practiced transfers a few times.    Balance Overall balance assessment: Needs assistance         Standing balance support: Single extremity supported;Bilateral upper extremity supported;During functional activity Standing balance-Leahy Scale: Poor                     ADL Overall ADL's : Needs assistance/impaired     Grooming: Wash/dry hands;Wash/dry face;Applying deodorant;Minimal assistance;Moderate assistance;Sitting;Standing (also prepared toothbrush but did not perform oral care-NPO)   Upper Body Bathing: Moderate assitance;Sitting   Lower Body Bathing: Moderate assistance;Sit to/from  stand (balance)           Toilet Transfer: Moderate assistance;Stand-pivot (bed/chair)           Functional mobility during ADLs: Moderate assistance (stand pivot and sit to stand transfers) General ADL Comments: Pt performed grooming standing as well as sitting. Tried to get pt to use Rt hand as gross assist and demonstrated how he could do this.  Pt able to problem solve on how to apply deodorant under left arm. Pt also performed bathing tasks. Instructed caregiver not to let anyone be pulling on pt's arms.       Vision                     Perception     Praxis      Cognition   Behavior During Therapy: [facility] for tasks assessed/performed Overall Cognitive Status: Impaired/Different from baseline Area of Impairment: Memory;Problem solving;Following commands;Awareness;Attention   Current Attention Level: Selective Memory: Decreased short-term memory  Following Commands: Follows one step commands inconsistently   Awareness: Intellectual Problem Solving: Slow processing      Extremity/Trunk Assessment               Exercises     Shoulder Instructions       General Comments      Pertinent Vitals/ Pain       No apparent distress.  Home Living  Prior Functioning/Environment              Frequency Min 3X/week     Progress Toward Goals  OT Goals(current goals can now be found in the care plan section)  Progress towards OT goals: Progressing toward goals  Acute Rehab OT Goals Patient Stated Goal: not stated OT Goal Formulation: With patient Time For Goal Achievement: 11/05/13 Potential to Achieve Goals: Good ADL Goals Pt Will Perform Grooming: with supervision;sitting Pt Will Perform Upper Body Bathing: with supervision;sitting Pt Will Perform Lower Body Bathing: with min assist;sit to/from stand Pt Will Transfer to Toilet: with min assist;ambulating;bedside commode;grab  bars;regular height toilet Pt Will Perform Toileting - Clothing Manipulation and hygiene: with min assist;sit to/from stand Additional ADL Goal #1: Pt will use Rt. UE as a gross assist.   Plan Discharge plan remains appropriate    Co-evaluation                 End of Session Equipment Utilized During Treatment: Gait belt   Activity Tolerance Patient tolerated treatment well   Patient Left in chair;with family/visitor present;with call bell/phone within reach (caregiver states she is staying in room with pt)   Nurse Communication          Time: 3154-0086 OT Time Calculation (min): 34 min  Charges: OT General Charges $OT Visit: 1 Procedure OT Treatments $Self Care/Home Management : 8-22 mins $Therapeutic Activity: 8-22 mins  Benito Mccreedy OTR/L [phone] 10/23/2013, 12:28 PM

## 2013-10-23 NOTE — Progress Notes (Signed)
10/23/2013 1300 Medicare IM given, placed on chart. Jonnie Finner RN CCM Case Mgmt phone [phone]

## 2013-10-23 NOTE — H&P (View-Only) (Signed)
Patient Demographics  Trevor Lutz, is a 80 y.o. male, DOB - 07-Jun-1922, DGU:[id number]  Admit date - 10/20/2013   Admitting Physician Trevor Cousins, MD  Outpatient Primary MD for the patient is Trevor Shelling, MD  LOS - 3   Chief Complaint  Patient presents with  . Code Stroke        Assessment & Plan    1. 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 He has had previous history of CVAs, was on aspirin and switched to Plavix by neurology, seen by PT-OT & speech, noted MRI MRA brain, stable TTE and carotid duplex.   Will  need TEE on 10-23-13    Lab Results  Component Value Date   HGBA1C 6.1* 10/21/2013    Lab Results  Component Value Date   CHOL 138 10/21/2013   HDL 44 10/21/2013   LDLCALC 78 10/21/2013   TRIG 80 10/21/2013   CHOLHDL 3.1 10/21/2013     2. Underlying Alzheimer's dementia. At risk for delirium, we'll continue supportive care with Depakote along with Namenda.    3. BPH. Continue Flomax. Developed urinary retention on 10/23/2013, if continues to retain Urine Will place Foley.    4. Dyslipidemia. On home dose statin. Lipid panel stable.    5. HTN. Allowing for permissive hypertension. As needed IV hydralazine.       Code Status: DNR now  Family Communication:    Updated wife Trevor Lutz and brother-in-law who is a physician on 10/21/2013 @ 6 PM.   Called and left message for wife Trevor Lutz on 10/23/2013 11 AM  Talked to Trevor Lutz 10-23-13 @ 12.15 pm her Cell [phone] (she was in the rm)    Disposition Plan: SNF   Procedures CT head, MRI brain, MRA head, echo, carotid duplex, TEE   Consults  Neuro   Medications  Scheduled Meds: . citalopram  10 mg Oral Daily  . clopidogrel  75 mg Oral Q breakfast  .  divalproex  250 mg Oral BID  . galantamine  12 mg Oral BID WC  . heparin  5,000 Units Subcutaneous 3 times per day  . insulin aspart  0-9 Units Subcutaneous TID WC  . memantine  10 mg Oral BID  . simvastatin  40 mg Oral QPM  . tamsulosin  0.4 mg Oral QHS  . vitamin B-12  100 mcg Oral Daily   Continuous Infusions: . sodium chloride     PRN Meds:.acetaminophen, haloperidol lactate, senna-docusate  DVT Prophylaxis   heparin  Lab Results  Component Value Date   PLT 126* 10/20/2013    Antibiotics     Anti-infectives   None          Subjective:   Trevor Lutz today has, No headache, No chest pain, No abdominal pain - No Nausea, No new weakness tingling or numbness, No Cough - SOB.   Objective:   Filed Vitals:   10/22/13 1855 10/22/13 2225 10/23/13 0238 10/23/13 0545  BP: 131/69 152/68 117/60 133/63  Pulse: 72 63 67 60  Temp: 98.1 F (36.7 C) 98.1 F (36.7 C) 97.6 F (36.4 C) 97.5 F (36.4 C)  TempSrc: Oral Oral Oral Oral  Resp: 18 18 18 18   Height:      Weight:  SpO2: 95% 95% 93% 95%    Wt Readings from Last 3 Encounters:  10/20/13 74.702 kg (164 lb 11 oz)  10/20/13 74.702 kg (164 lb 11 oz)  11/13/12 73.392 kg (161 lb 12.8 oz)     Intake/Output Summary (Last 24 hours) at 10/23/13 1105 Last data filed at 10/22/13 1342  Gross per 24 hour  Intake      0 ml  Output    150 ml  Net   -150 ml     Physical Exam  Awake , not Alert, Oriented X 0,R side 4/5, Exp aphasia, mildly agitated affect [place].AT,PERRAL Supple Neck,No JVD, No cervical lymphadenopathy appriciated.  Symmetrical Chest wall movement, Good air movement bilaterally, CTAB RRR,No Gallops,Rubs or new Murmurs, No Parasternal Heave +ve B.Sounds, Abd Soft, Non tender, No organomegaly appriciated, No rebound - guarding or rigidity. Foley No Cyanosis, Clubbing or edema, No new Rash or bruise      Data Review   Micro Results No results found for this or any previous visit (from the past 240  hour(s)).  Radiology Reports  Carotids  Bilateral: mild calcific plaque origin ICA. 1-39% ICA stenosis. Vertebral artery flow is antegrade.   TTE  Left ventricle: The cavity size was normal. Wall thickness was increased in a pattern of mild LVH. Systolic function was normal. The estimated ejection fraction was in the range of 50% to 55%. Wall motion was normal; there were no regional wall motion abnormalities. Doppler parameters are consistent with abnormal left ventricular relaxation (grade 1 diastolic dysfunction).    TEE      Dg Chest 2 View  10/20/2013   CLINICAL DATA:  Stroke.  Current history of hypertension.  EXAM: CHEST  2 VIEW  COMPARISON:  DG CHEST 2 VIEW dated 11/10/2012; DG CHEST 2 VIEW dated 02/27/2004.  FINDINGS: Suboptimal inspiration accounts for crowded bronchovascular markings, especially in the bases, and accentuates the cardiac silhouette. Taking this into account, cardiac silhouette upper normal in size and stable. Thoracic aorta mildly atherosclerotic. Hilar and mediastinal contours otherwise unremarkable. 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 Chronic linear scar/atelectasis in the right middle lobe, unchanged. Lungs otherwise clear. No localized airspace consolidation. No pleural effusions. No pneumothorax. Normal pulmonary vascularity. Degenerative changes involving the thoracic spine.  IMPRESSION: Suboptimal inspiration. No acute cardiopulmonary disease. Chronic scar/atelectasis in the right middle lobe related to chronic eventration of the right anterior hemidiaphragm.   Electronically Signed   By: Evangeline Dakin M.D.   On: 10/20/2013 21:15   Ct Head Wo Contrast  10/20/2013   CLINICAL DATA:  Code stroke, right arm weakness  EXAM: CT HEAD WITHOUT CONTRAST  TECHNIQUE: Contiguous axial images were obtained from the base of the skull through the vertex without intravenous contrast.  COMPARISON:  MR  11/17/2010  FINDINGS: Retention cyst or polyp in the right frontal sinus. Atherosclerotic and physiologic intracranial calcifications. Large lacunar infarct in the left basal ganglia and corona radiata with adjacent hypoattenuation in the adjacent white matter and some mild ex vacuo dilatation of the adjacent lateral ventricle. There is a medial left occipital lobe subacute or chronic infarct which was not conspicuous on the prior study. No acute intracranial hemorrhage, midline shift, mass, or mass effect. Moderate diffuse parenchymal atrophy. Mild patchy areas of hypoattenuation in deep and periventricular white matter bilaterally.  IMPRESSION: 1. Negative for bleed or other acute intracranial process. 2. Old left basal ganglia/corona radiata and left occipital infarcts. 3. Atrophy and nonspecific white  matter changes. Critical Value/emergent results were called by telephone at the time of interpretation on 10/20/2013 at 2:49 PM to Dr. Doy Mince, who verbally acknowledged these results.   Electronically Signed   By: Arne Cleveland M.D.   On: 10/20/2013 14:49    CBC  Recent Labs Lab 10/20/13 1447 10/20/13 1456  WBC 5.1  --   HGB 14.7 15.3  HCT 43.7 45.0  PLT 126*  --   MCV 95.2  --   MCH 32.0  --   MCHC 33.6  --   RDW 14.4  --   LYMPHSABS 1.7  --   MONOABS 0.7  --   EOSABS 0.3  --   BASOSABS 0.0  --     Chemistries   Recent Labs Lab 10/20/13 1447 10/20/13 1456  NA 139 141  K 5.0 4.5  CL 102 101  CO2 25  --   GLUCOSE 98 103*  BUN 19 20  CREATININE 1.05 1.30  CALCIUM 8.4  --   AST 31  --   ALT 19  --   ALKPHOS 51  --   BILITOT 0.3  --    ------------------------------------------------------------------------------------------------------------------ estimated creatinine clearance is 36 ml/min (by C-G formula based on Cr of 1.3). ------------------------------------------------------------------------------------------------------------------  Recent Labs  10/21/13 0515    HGBA1C 6.1*   ------------------------------------------------------------------------------------------------------------------  Recent Labs  10/21/13 0515  CHOL 138  HDL 44  LDLCALC 78  TRIG 80  CHOLHDL 3.1   ------------------------------------------------------------------------------------------------------------------ No results found for this basename: TSH, T4TOTAL, FREET3, T3FREE, THYROIDAB,  in the last 72 hours ------------------------------------------------------------------------------------------------------------------ No results found for this basename: VITAMINB12, FOLATE, FERRITIN, TIBC, IRON, RETICCTPCT,  in the last 72 hours  Coagulation profile  Recent Labs Lab 10/20/13 1447  INR 1.03    No results found for this basename: DDIMER,  in the last 72 hours  Cardiac Enzymes No results found for this basename: CK, CKMB, TROPONINI, MYOGLOBIN,  in the last 168 hours ------------------------------------------------------------------------------------------------------------------ No components found with this basename: POCBNP,      Time Spent in minutes   35   Thurnell Lose M.D on 10/23/2013 at 11:05 AM  Between 7am to 7pm - Pager - [phone]  After 7pm go to [email or web address] - password [email or web address]  And look for the night coverage person covering for me after hours  [facility] Office  [phone]

## 2013-10-23 NOTE — CV Procedure (Signed)
    Transesophageal Echocardiogram Note  HALSEY HAMMEN [id number] 1921-07-01  Procedure: Transesophageal Echocardiogram Indications: CVA  Procedure Details Consent: Obtained Time Out: Verified patient identification, verified procedure, site/side was marked, verified correct patient position, special equipment/implants available, Radiology Safety Procedures followed,  medications/allergies/relevent history reviewed, required imaging and test results available.  Performed  Medications: Fentanyl: 50 mcg iv  Versed:  4 mg iv  Left Ventrical:  Normal LV function  Mitral Valve: mild MR  Aortic Valve: normal   Tricuspid Valve: no TR  Pulmonic Valve: normal   Left Atrium/ Left atrial appendage: normal , no thrombi  Atrial septum: intact. No PFO by color and bubble study.  Aorta: mild calcification   Complications: No apparent complications Patient did tolerate procedure well.   Thayer Headings, Brooke Bonito., MD, [facility] 10/23/2013, 4:01 PM

## 2013-10-23 NOTE — Progress Notes (Signed)
  Echocardiogram Echocardiogram Transesophageal has been performed.  Trevor Lutz Trevor Lutz 10/23/2013, 4:58 PM

## 2013-10-23 NOTE — Interval H&P Note (Signed)
History and Physical Interval Note:  10/23/2013 3:40 PM  Trevor Lutz  has presented today for surgery, with the diagnosis of stroke  The various methods of treatment have been discussed with the patient and family. After consideration of risks, benefits and other options for treatment, the patient has consented to  Procedure(s): TRANSESOPHAGEAL ECHOCARDIOGRAM (TEE) (N/A) as a surgical intervention .  The patient's history has been reviewed, patient examined, no change in status, stable for surgery.  I have reviewed the patient's chart and labs.  Questions were answered to the patient's satisfaction.     Wonda Cheng Nahser

## 2013-10-23 NOTE — Progress Notes (Signed)
Clinical Social Work Department CLINICAL SOCIAL WORK PLACEMENT NOTE 10/23/2013  Patient:  Trevor Lutz, Trevor Lutz  Account Number:  [id number] Admit date:  10/20/2013  Clinical Social Worker:  Hunt Oris, Latanya Presser  Date/time:  10/23/2013 01:40 PM  Clinical Social Work is seeking post-discharge placement for this patient at the following level of care:   SKILLED NURSING   (*CSW will update this form in Epic as items are completed)   10/23/2013  Patient/family provided with [facility] Department of Clinical Social Work's list of facilities offering this level of care within the geographic area requested by the patient (or if unable, by the patient's family).  10/23/2013  Patient/family informed of their freedom to choose among providers that offer the needed level of care, that participate in Medicare, Medicaid or managed care program needed by the patient, have an available bed and are willing to accept the patient.  10/23/2013  Patient/family informed of [facility]' ownership interest in [facility], as well as of the fact that they are under no obligation to receive care at this facility.  PASARR submitted to EDS on 10/23/2013 PASARR number received from EDS on 10/23/2013  FL2 transmitted to all facilities in geographic area requested by pt/family on  10/23/2013 FL2 transmitted to all facilities within larger geographic area on   Patient informed that his/her managed care company has contracts with or will negotiate with  certain facilities, including the following:     Patient/family informed of bed offers received:   Patient chooses bed at  Physician recommends and patient chooses bed at    Patient to be transferred to  on   Patient to be transferred to facility by   The following physician request were entered in Epic:   Additional Comments:  Hunt Oris, MSW, [place]

## 2013-10-23 NOTE — Progress Notes (Signed)
Charge RN Mable Fill was consulted regarding placement of foley.

## 2013-10-24 ENCOUNTER — Encounter (HOSPITAL_COMMUNITY): Payer: Self-pay | Admitting: Cardiovascular Disease

## 2013-10-24 ENCOUNTER — Encounter (HOSPITAL_COMMUNITY)
Admission: EM | Disposition: A | Discharge status: Skilled Nursing Facility | Payer: Self-pay | Source: Home / Self Care | Attending: Internal Medicine

## 2013-10-24 DIAGNOSIS — I6350 Cerebral infarction due to unspecified occlusion or stenosis of unspecified cerebral artery: Secondary | ICD-10-CM

## 2013-10-24 HISTORY — PX: LOOP RECORDER IMPLANT: SHX5954

## 2013-10-24 HISTORY — PX: LOOP RECORDER IMPLANT: SHX5477

## 2013-10-24 LAB — GLUCOSE, CAPILLARY
GLUCOSE-CAPILLARY: 71 mg/dL (ref 70–99)
GLUCOSE-CAPILLARY: 99 mg/dL (ref 70–99)
Glucose-Capillary: 124 mg/dL — ABNORMAL HIGH (ref 70–99)
Glucose-Capillary: 94 mg/dL (ref 70–99)
Glucose-Capillary: 131 mg/dL — ABNORMAL HIGH (ref 70–99)

## 2013-10-24 SURGERY — LOOP RECORDER IMPLANT
Anesthesia: LOCAL

## 2013-10-24 MED ORDER — CLOPIDOGREL BISULFATE 75 MG PO TABS: 75.0000 mg | ORAL_TABLET | Freq: Every day | ORAL | Status: DC

## 2013-10-24 MED ORDER — LIDOCAINE-EPINEPHRINE 1 %-1:100000 IJ SOLN
INTRAMUSCULAR | Status: AC
  Filled 2013-10-24: qty 1

## 2013-10-24 NOTE — Progress Notes (Signed)
CSW received a call from Trevor Lutz who presents as pt and wife's financial counselor and assisting with placement. CSW informed Melissa via email that [facility] is able to offer placement today. Ivin Booty at [facility] to contact [place] regarding completing paperwork.   Unit CSW to assist with dc once insurance authorization is received.  Hunt Oris, MSW, LCSW

## 2013-10-24 NOTE — Discharge Instructions (Signed)
Follow with Primary MD Irven Shelling, MD in 7 days   Get CBC, CMP, checked 7 days by Primary MD and again as instructed by your Primary MD. Get a 2 view Chest X ray done next visit.   Activity: As tolerated with Full fall precautions use walker/cane & assistance as needed   Disposition SNF   Diet: Heart Healthy with feeding assistance and aspiration precautions if needed.  For Heart failure patients - Check your Weight same time everyday, if you gain over 2 pounds, or you develop in leg swelling, experience more shortness of breath or chest pain, call your Primary MD immediately. Follow Cardiac Low Salt Diet and 1.8 lit/day fluid restriction.   On your next visit with her primary care physician please Get Medicines reviewed and adjusted.  Please request your Prim.MD to go over all Hospital Tests and Procedure/Radiological results at the follow up, please get all Hospital records sent to your Prim MD by signing hospital release before you go home.   If you experience worsening of your admission symptoms, develop shortness of breath, life threatening emergency, suicidal or homicidal thoughts you must seek medical attention immediately by calling 911 or calling your MD immediately  if symptoms less severe.  You Must read complete instructions/literature along with all the possible adverse reactions/side effects for all the Medicines you take and that have been prescribed to you. Take any new Medicines after you have completely understood and accpet all the possible adverse reactions/side effects.   Do not drive and provide baby sitting services if your were admitted for syncope or siezures until you have seen by Primary MD or a Neurologist and advised to do so again.  Do not drive when taking Pain medications.    Do not take more than prescribed Pain, Sleep and Anxiety Medications  Special Instructions: If you have smoked or chewed Tobacco  in the last 2 yrs please stop smoking,  stop any regular Alcohol  and or any Recreational drug use.  Wear Seat belts while driving.   Please note  You were cared for by a hospitalist during your hospital stay. If you have any questions about your discharge medications or the care you received while you were in the hospital after you are discharged, you can call the unit and asked to speak with the hospitalist on call if the hospitalist that took care of you is not available. Once you are discharged, your primary care physician will handle any further medical issues. Please note that NO REFILLS for any discharge medications will be authorized once you are discharged, as it is imperative that you return to your primary care physician (or establish a relationship with a primary care physician if you do not have one) for your aftercare needs so that they can reassess your need for medications and monitor your lab values. _____________________________________________________________________________________    Supplemental Discharge Instructions following  Loop Recorder Insertion  WOUND CARE   Keep the wound area clean and dry.  Do not get this area wet for 48 hours. No shower for 48 hours.   The tape/steri-strips on your wound will fall off; do not pull them off.  No bandage is needed on the site.  DO NOT apply any creams, oils, or ointments to the wound area.   If you notice any drainage or discharge from the wound, any swelling or bruising at the site, or you develop a fever > 101? F after you are discharged home, call the office at once.  _____________________________________________________________________________________ °

## 2013-10-24 NOTE — CV Procedure (Signed)
Pre op Dx cryptogenic stroke Post op Dx same 720  Procedure  Loop Recorder implantation  After routine prep and drape of the left parasternal area, a small incision was created. A Medtronic LINQ Reveal Loop Recorder  Serial Number  [id number] was inserted.    Dermabond was  applied.  The patient tolerated the procedure without apparent complication.

## 2013-10-24 NOTE — Consult Note (Signed)
ELECTROPHYSIOLOGY CONSULT NOTE   Patient ID: Trevor Lutz MRN: [id number], DOB/AGE: [birth year]   Admit date: 10/20/2013 Date of Consult: 10/24/2013  Primary Physician: Irven Shelling, MD Primary Cardiologist: None Reason for Consultation: Cryptogenic stroke; recommendations regarding Implantable Loop Recorder  History of Present Illness Trevor Lutz is an 80 year old man with HTN, dyslipidemia, prior CVA and dementia who was admitted on 10/20/2013 with acute CVA. He has been monitored on telemetry which has demonstrated no arrhythmias. No cause has been identified. Inpatient stroke work-up is underway. TEE yesterday was negative. EP has been asked to evaluate for placement of an implantable loop recorder to monitor for atrial fibrillation.  Past Medical History Past Medical History  Diagnosis Date  . Hypertension   . BPH (benign prostatic hyperplasia)     HAS INDWELLING FOLEY CATHETER  . Alzheimer disease   . Hyperlipidemia   . Erectile dysfunction   . Mild depression   . Spinal stenosis   . CVA (cerebral vascular accident)     Past Surgical History Past Surgical History  Procedure Laterality Date  . Kidney surgery      right kidney removed  . Tonsilectomy, adenoidectomy, bilateral myringotomy and tubes    . Hernia repair Left 09/24/1996    Indirect, Dr Lucia Gaskins surgeon  . Transurethral resection of prostate N/A 11/13/2012    Procedure: BI POLAR TRANSURETHRAL RESECTION OF THE PROSTATE (TURP);  Surgeon: Fredricka Bonine, MD;  Location: [facility];  Service: Urology;  Laterality: N/A;  . Tee without cardioversion N/A 10/23/2013    Procedure: TRANSESOPHAGEAL ECHOCARDIOGRAM (TEE);  Surgeon: Thayer Headings, MD;  Location: [place];  Service: Cardiovascular;  Laterality: N/A;    Allergies/Intolerances No Known Allergies  Inpatient Medications . citalopram  10 mg Oral Daily  . clopidogrel  75 mg Oral Q breakfast  . divalproex  250 mg Oral BID  . galantamine  12 mg  Oral BID WC  . heparin  5,000 Units Subcutaneous 3 times per day  . insulin aspart  0-9 Units Subcutaneous TID WC  . memantine  10 mg Oral BID  . simvastatin  40 mg Oral QPM  . tamsulosin  0.4 mg Oral QHS  . vitamin B-12  100 mcg Oral Daily    Social History History   Social History  . Marital Status: Married    Spouse Name: N/A    Number of Children: N/A  . Years of Education: N/A   Occupational History  . Not on file.   Social History Main Topics  . Smoking status: Former Smoker    Types: Cigarettes  . Smokeless tobacco: Not on file     Comment: QUIT SMOKING MANY YEARS AGO  . Alcohol Use: Yes  . Drug Use: No  . Sexual Activity: No   Other Topics Concern  . Not on file   Social History Narrative  . No narrative on file    Review of Systems General: No chills, fever, night sweats or weight changes  Cardiovascular:  No chest pain, dyspnea on exertion, edema, orthopnea, palpitations, paroxysmal nocturnal dyspnea Dermatological: No rash, lesions or masses Respiratory: No cough, dyspnea Urologic: No hematuria, dysuria Abdominal: No nausea, vomiting, diarrhea, bright red blood per rectum, melena, or hematemesis Neurologic: No visual changes, weakness, changes in mental status All other systems reviewed and are otherwise negative except as noted above.  Physical Exam Blood pressure 139/50, pulse 69, temperature 98.3 F (36.8 C), temperature source Oral, resp. rate 18, height 5\' 7"  (1.702  m), weight 164 lb 11 oz (74.702 kg), SpO2 95.00%.  General: Well developed, well appearing 80 y.o. male in no acute distress. HEENT: Normocephalic, atraumatic. EOMs intact. Sclera nonicteric. Oropharynx clear.  Neck: Supple. No JVD. Lungs: Respirations regular and unlabored, CTA bilaterally. No wheezes, rales or rhonchi. Heart: RRR. S1, S2 present. No murmurs, rub, S3 or S4. Abdomen: Soft, non-distended.  Extremities: No clubbing, cyanosis or edema. PT/Radials 2+ and equal  bilaterally. Neuro: Alert. Moves all extremities spontaneously.  Weakness and poor mobility of R side Psych  [perseveration with questions of weakness Musculoskeletal: No kyphosis. Skin: Intact. Warm and dry. No rashes or petechiae in exposed areas.   Labs Lab Results  Component Value Date   WBC 5.1 10/20/2013   HGB 15.3 10/20/2013   HCT 45.0 10/20/2013   MCV 95.2 10/20/2013   PLT 126* 10/20/2013    Recent Labs Lab 10/20/13 1447 10/20/13 1456  NA 139 141  K 5.0 4.5  CL 102 101  CO2 25  --   BUN 19 20  CREATININE 1.05 1.30  CALCIUM 8.4  --   PROT 6.3  --   BILITOT 0.3  --   ALKPHOS 51  --   ALT 19  --   AST 31  --   GLUCOSE 98 103*    Radiology/Studies Dg Chest 2 View  10/20/2013   CLINICAL DATA:  Stroke.  Current history of hypertension.  EXAM: CHEST  2 VIEW  COMPARISON:  DG CHEST 2 VIEW dated 11/10/2012; DG CHEST 2 VIEW dated 02/27/2004.  FINDINGS: Suboptimal inspiration accounts for crowded bronchovascular markings, especially in the bases, and accentuates the cardiac silhouette. Taking this into account, cardiac silhouette upper normal in size and stable. Thoracic aorta mildly atherosclerotic. Hilar and mediastinal contours otherwise unremarkable. 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 Chronic linear scar/atelectasis in the right middle lobe, unchanged. Lungs otherwise clear. No localized airspace consolidation. No pleural effusions. No pneumothorax. Normal pulmonary vascularity. Degenerative changes involving the thoracic spine.  IMPRESSION: Suboptimal inspiration. No acute cardiopulmonary disease. Chronic scar/atelectasis in the right middle lobe related to chronic eventration of the right anterior hemidiaphragm.   Electronically Signed   By: Evangeline Dakin M.D.   On: 10/20/2013 21:15   Ct Head Wo Contrast  10/20/2013   CLINICAL DATA:  Code stroke, right arm weakness  EXAM: CT HEAD WITHOUT CONTRAST  TECHNIQUE: Contiguous  axial images were obtained from the base of the skull through the vertex without intravenous contrast.  COMPARISON:  MR 11/17/2010  FINDINGS: Retention cyst or polyp in the right frontal sinus. Atherosclerotic and physiologic intracranial calcifications. Large lacunar infarct in the left basal ganglia and corona radiata with adjacent hypoattenuation in the adjacent white matter and some mild ex vacuo dilatation of the adjacent lateral ventricle. There is a medial left occipital lobe subacute or chronic infarct which was not conspicuous on the prior study. No acute intracranial hemorrhage, midline shift, mass, or mass effect. Moderate diffuse parenchymal atrophy. Mild patchy areas of hypoattenuation in deep and periventricular white matter bilaterally.  IMPRESSION: 1. Negative for bleed or other acute intracranial process. 2. Old left basal ganglia/corona radiata and left occipital infarcts. 3. Atrophy and nonspecific white matter changes. Critical Value/emergent results were called by telephone at the time of interpretation on 10/20/2013 at 2:49 PM to Dr. Doy Mince, who verbally acknowledged these results.   Electronically Signed   By: Arne Cleveland M.D.   On: 10/20/2013 14:49   Mr  Mra Head Wo Contrast  10/21/2013   CLINICAL DATA:  Progressive right upper extremity weakness. Increased confusion. History of prior CVAs x 3.  EXAM: MRI HEAD WITHOUT CONTRAST  MRA HEAD WITHOUT CONTRAST  TECHNIQUE: Multiplanar, multiecho pulse sequences of the brain and surrounding structures were obtained without intravenous contrast. Angiographic images of the head were obtained using MRA technique without contrast.  COMPARISON:  CT head without contrast 10/20/2013. MRI brain 11/17/2010.  FINDINGS: MRI HEAD FINDINGS  The diffusion-weighted images demonstrate acute nonhemorrhagic infarcts extending through the posterior left frontal and parietal lobes within a watershed distribution. There additional foci of infarct extending more  inferiorly in the left parietal lobe, potentially along the left MCA and PCA watershed territory.  A remote left MCA territory infarct involving the left frontal operculum is similar to the prior exam. New white matter changes are associated with the acute/subacute infarct. Extensive white matter changes are otherwise similar to the prior study. Moderate generalized atrophy and periventricular white matter changes are present bilaterally. A lacunar infarct in the left thalamus is new since prior study but is not acute.  A medial left occipital lobe infarct is also new since the prior study, but not acute.  Flow is present in the major intracranial arteries. The globes and orbits are intact. A prominent polyp or mucous retention cyst in the right frontal sinus is slightly larger than on the prior exam. Mild mucosal thickening is present throughout the paranasal sinuses. There is some fluid in the mastoid air cells bilaterally. No obstructing nasopharyngeal lesion is evident.  MRA HEAD FINDINGS  The time-of-flight images are somewhat degraded by patient motion. The internal carotid arteries are within normal limits from the high cervical segments through the ICA termini. The A1 and M1 segments are intact. No definite anterior communicating artery is evident. The MCA bifurcations are intact. There is some attenuation of MCA branch vessels bilaterally.  The left vertebral artery is the dominant vessel. The PICA origins are not visualized of the there is poor signal over the proximal vertebral arteries, likely artifactual. The basilar artery is intact. The posterior cerebral arteries originate from the basilar tip. The left posterior cerebral artery is occluded. There is some attenuation of distal right PCA branch vessels.  IMPRESSION: 1. Acute nonhemorrhagic infarcts within the left frontal and parietal lobe appear to be within a watershed distribution. 2. Additional acute nonhemorrhagic infarcts within the left  parietal and occipital lobe are likely watershed type infarcts as well. 3. Proximal occlusion of the left posterior cerebral artery. 4. Stable remote left MCA territory infarct within the left frontal operculum. 5. Medial left occipital lobe infarct and left thalamic infarcts are is new since the prior study but not acute. 6. Mild to moderate small vessel disease throughout the circle of Willis. These results were called by telephone at the time of interpretation on 10/21/2013 at 12:28 PM to Dr. Thana Farr , who verbally acknowledged these results.   Electronically Signed   By: Gennette Pac M.D.   On: 10/21/2013 12:34   Mr Brain Wo Contrast  10/21/2013   CLINICAL DATA:  Progressive right upper extremity weakness. Increased confusion. History of prior CVAs x 3.  EXAM: MRI HEAD WITHOUT CONTRAST  MRA HEAD WITHOUT CONTRAST  TECHNIQUE: Multiplanar, multiecho pulse sequences of the brain and surrounding structures were obtained without intravenous contrast. Angiographic images of the head were obtained using MRA technique without contrast.  COMPARISON:  CT head without contrast 10/20/2013. MRI brain 11/17/2010.  FINDINGS: MRI  HEAD FINDINGS  The diffusion-weighted images demonstrate acute nonhemorrhagic infarcts extending through the posterior left frontal and parietal lobes within a watershed distribution. There additional foci of infarct extending more inferiorly in the left parietal lobe, potentially along the left MCA and PCA watershed territory.  A remote left MCA territory infarct involving the left frontal operculum is similar to the prior exam. New white matter changes are associated with the acute/subacute infarct. Extensive white matter changes are otherwise similar to the prior study. Moderate generalized atrophy and periventricular white matter changes are present bilaterally. A lacunar infarct in the left thalamus is new since prior study but is not acute.  A medial left occipital lobe infarct is also  new since the prior study, but not acute.  Flow is present in the major intracranial arteries. The globes and orbits are intact. A prominent polyp or mucous retention cyst in the right frontal sinus is slightly larger than on the prior exam. Mild mucosal thickening is present throughout the paranasal sinuses. There is some fluid in the mastoid air cells bilaterally. No obstructing nasopharyngeal lesion is evident.  MRA HEAD FINDINGS  The time-of-flight images are somewhat degraded by patient motion. The internal carotid arteries are within normal limits from the high cervical segments through the ICA termini. The A1 and M1 segments are intact. No definite anterior communicating artery is evident. The MCA bifurcations are intact. There is some attenuation of MCA branch vessels bilaterally.  The left vertebral artery is the dominant vessel. The PICA origins are not visualized of the there is poor signal over the proximal vertebral arteries, likely artifactual. The basilar artery is intact. The posterior cerebral arteries originate from the basilar tip. The left posterior cerebral artery is occluded. There is some attenuation of distal right PCA branch vessels.  IMPRESSION: 1. Acute nonhemorrhagic infarcts within the left frontal and parietal lobe appear to be within a watershed distribution. 2. Additional acute nonhemorrhagic infarcts within the left parietal and occipital lobe are likely watershed type infarcts as well. 3. Proximal occlusion of the left posterior cerebral artery. 4. Stable remote left MCA territory infarct within the left frontal operculum. 5. Medial left occipital lobe infarct and left thalamic infarcts are is new since the prior study but not acute. 6. Mild to moderate small vessel disease throughout the circle of Willis. These results were called by telephone at the time of interpretation on 10/21/2013 at 12:28 PM to Dr. Alexis Goodell , who verbally acknowledged these results.   Electronically  Signed   By: Lawrence Santiago M.D.   On: 10/21/2013 12:34    Transthoracic echocardiogram 10/22/2013 Study Conclusions Left ventricle: The cavity size was normal. Wall thickness was increased in a pattern of mild LVH. Systolic function was normal. The estimated ejection fraction was in the range of 50% to 55%. Wall motion was normal; there were no regional wall motion abnormalities. Doppler parameters are consistent with abnormal left ventricular relaxation (grade 1 diastolic dysfunction). Impression: - No cardiac source of emboli was indentified.  Transesophageal echocardiogram 10/23/2013  Left Ventricle: Normal LV function  Mitral Valve: mild MR  Aortic Valve: normal  Tricuspid Valve: no TR  Pulmonic Valve: normal  Left Atrium/ Left atrial appendage: normal , no thrombi  Atrial septum: intact. No PFO by color and bubble study.  Aorta: mild calcification  Complications: No apparent complications  Patient tolerated procedure well.   12-lead ECG on admission - NSR with normal intervals at 58 bpm Telemetry reviewed in full - SR with  occasional PVCs; no arrhythmias  Assessment and Plan Cryptogenic stroke  TEE is negative. Therefore, we recommend loop recorder insertion to monitor for AF. The indication for loop recorder insertion / monitoring for AF in setting of cryptogenic stroke was discussed with the patient. The loop recorder insertion procedure was reviewed in detail including risks and benefits. These risks include but are not limited to bleeding and infection. The patient expressed verbal understanding and agrees to proceed. Also spoke with wife, Ladora Daniel, via phone who expressed verbal understanding and agrees with plan. The patient was also counseled regarding wound care and device follow-up.  Currie Paris Edmisten 10/24/2013, 9:01 AM    Cryptogenic stroke with residual right hemiparesis For LINQ recorder insertion

## 2013-10-24 NOTE — Progress Notes (Addendum)
Physical Therapy Treatment Patient Details Name: Trevor Lutz MRN: [id number] DOB: [birth year] Today's Date: 10/24/2013    History of Present Illness 80 y.o. male admitted to [facility] on 10/20/13 with  acute onset difficulty with speech and right upper extremity weakness. When symptoms did not improve EMS was called and the patient was brought in as a code stroke. MRI revealed acute left frontal, parietal, and occipital lobe infarcts.  Pt with significant PMHx of HTN, Alzheimer's dementia (with 24/7 caregivers mostly for supervision), and indwelling catheter due to BPH.      PT Comments    Pt is progressing well with his mobility.  He at times shows some awareness of why his right side is weak, but it is often fleeting.  He has shown some improvement in strength and control of his right side, although there are still deficits.  I continue to endorse rehab either inpatient or SNF level for continued intensive therapy prior to d/c home with caregivers.   Follow Up Recommendations  CIR     Equipment Recommendations  Wheelchair (measurements PT);Wheelchair cushion (measurements PT)    Recommendations for Other Services   NA     Precautions / Restrictions Precautions Precautions: Fall Precaution Comments: right sided arm and leg weakness, decreased safety awareness.      Mobility  Bed Mobility Overal bed mobility: Needs Assistance Bed Mobility: Supine to Sit     Supine to sit: Min assist     General bed mobility comments: Min assist to support trunk and right arm in transition to sitting.    Transfers Overall transfer level: Needs assistance Equipment used: 1 person hand held assist Transfers: Sit to/from Stand Sit to Stand: Min assist;+2 safety/equipment         General transfer comment: Two person for safety due to decreased awareness, but pt able to power up from both bed and recliner chair with manual assist to support trunk and help position hands in safest position during  transitions (especially right hand)  Ambulation/Gait Ambulation/Gait assistance: +2 physical assistance;Mod assist Ambulation Distance (Feet): 80 Feet (with one seated rest break) Assistive device: 1 person hand held assist Gait Pattern/deviations: Decreased step length - right;Decreased dorsiflexion - right;Decreased weight shift to left;Trunk flexed Gait velocity: decreased   General Gait Details: two person assist one providing hand held assist to suppor trunk and one to help control right knee in stance (pt keeps it flexed and tends to buckle over it without support).  As pt fatigued he became more flexed and leaned more to the right.       Modified Rankin (Stroke Patients Only) Modified Rankin (Stroke Patients Only) Pre-Morbid Rankin Score: No symptoms Modified Rankin: Moderately severe disability     Balance Overall balance assessment: Needs assistance Sitting-balance support: Feet supported;Single extremity supported Sitting balance-Leahy Scale: Fair Sitting balance - Comments: Pt with right lean in sitting EOB today.  Having difficulty righting himself.   Postural control: Right lateral lean Standing balance support: Single extremity supported Standing balance-Leahy Scale: Poor                      Cognition Arousal/Alertness: Awake/alert Behavior During Therapy: WFL for tasks assessed/performed Overall Cognitive Status: Impaired/Different from baseline Area of Impairment: Memory;Following commands;Safety/judgement;Awareness;Problem solving   Current Attention Level: Selective Memory: Decreased short-term memory;Decreased recall of precautions Following Commands: Follows one step commands consistently Safety/Judgement: Decreased awareness of safety;Decreased awareness of deficits Awareness: Intellectual Problem Solving: Slow processing;Difficulty sequencing;Requires verbal cues;Requires tactile cues  Pertinent Vitals/Pain See vitals flow  sheet.            PT Goals (current goals can now be found in the care plan section) Acute Rehab PT Goals Patient Stated Goal: family would like intensive rehab.   Progress towards PT goals: Progressing toward goals    Frequency  Min 3X/week (likely going SNF)    PT Plan Frequency needs to be updated       End of Session Equipment Utilized During Treatment: Gait belt Activity Tolerance: Patient limited by fatigue Patient left: in chair;with call bell/phone within reach;with chair alarm set;with family/visitor present (personal caretaker in room)     Time: 3094-0768 PT Time Calculation (min): 27 min  Charges:  $Gait Training: 23-37 mins                      Sehaj Kolden B. [place], [place], DPT [phone]   10/24/2013, 10:43 AM

## 2013-10-24 NOTE — Discharge Summary (Addendum)
Trevor Lutz, is a 80 y.o. male  DOB 10/23/1921  MRN [id number].  Admission date:  10/20/2013  Admitting Physician  Charlynne Cousins, MD  Discharge Date:  10/24/2013   Primary MD  Irven Shelling, MD  Recommendations for primary care physician for things to follow:   Monitor clinically, aspiration precautions, must follow with urologist in week to 10 days   Admission Diagnosis  CVA (cerebral infarction) [434.91] Dementia [294.20] Essential hypertension [401.9]   Discharge Diagnosis  CVA (cerebral infarction) [434.91] Dementia [294.20] Essential hypertension [401.9]    Principal Problem:   CVA (cerebral infarction) Active Problems:   Dysmetria   Essential hypertension   Dementia   BPH (benign prostatic hyperplasia)   Dyslipidemia      Past Medical History  Diagnosis Date  . Hypertension   . BPH (benign prostatic hyperplasia)     HAS INDWELLING FOLEY CATHETER  . Alzheimer disease   . Hyperlipidemia   . Erectile dysfunction   . Mild depression   . Spinal stenosis   . CVA (cerebral vascular accident)     Past Surgical History  Procedure Laterality Date  . Kidney surgery      right kidney removed  . Tonsilectomy, adenoidectomy, bilateral myringotomy and tubes    . Hernia repair Left 09/24/1996    Indirect, Dr Lucia Gaskins surgeon  . Transurethral resection of prostate N/A 11/13/2012    Procedure: BI POLAR TRANSURETHRAL RESECTION OF THE PROSTATE (TURP);  Surgeon: Fredricka Bonine, MD;  Location: [facility];  Service: Urology;  Laterality: N/A;  . Tee without cardioversion N/A 10/23/2013    Procedure: TRANSESOPHAGEAL ECHOCARDIOGRAM (TEE);  Surgeon: Thayer Headings, MD;  Location: [facility] ENDOSCOPY;  Service: Cardiovascular;  Laterality: N/A;     Discharge Condition: Stable but poor long term prognosis   Follow  UP  Follow-up Information   Follow up with Forbes Cellar, MD. Schedule an appointment as soon as possible for a visit in 2 months.   Specialties:  Neurology, Radiology   Contact information:   [address] [place] [address] [phone]       Follow up with Irven Shelling, MD. Schedule an appointment as soon as possible for a visit in 1 week.   Specialty:  Internal Medicine   Contact information:   [address], [address] [place] [address] [phone]       Follow up with [facility]. Schedule an appointment as soon as possible for a visit in 1 week.   Contact information:   [address]  [address] [place] [address] [phone]         Discharge Instructions  and  Discharge Medications      Discharge Orders   Future Appointments Provider Department Dept Phone   11/28/2013 10:30 AM Marcial Pacas, MD [facility] [phone]   Future Orders Complete By Expires   Discharge instructions  As directed    Scheduling Instructions:   Follow with Primary MD Irven Shelling, MD in  7 days   Get CBC, CMP, checked 7 days by Primary MD and again as instructed by your Primary MD. Get a 2 view Chest X ray done next visit.   Activity: As tolerated with Full fall precautions use walker/cane & assistance as needed   Disposition SNF   Diet: Heart Healthy with feeding assistance and aspiration precautions if needed.  For Heart failure patients - Check your Weight same time everyday, if you gain over 2 pounds, or you develop in leg swelling, experience more shortness of breath or chest pain, call your Primary MD immediately. Follow Cardiac Low Salt Diet and 1.8 lit/day fluid restriction.   On your next visit with her primary care physician please Get Medicines reviewed and adjusted.  Please request your Prim.MD to go over all Hospital Tests and Procedure/Radiological results at the follow up, please get  all Hospital records sent to your Prim MD by signing hospital release before you go home.   If you experience worsening of your admission symptoms, develop shortness of breath, life threatening emergency, suicidal or homicidal thoughts you must seek medical attention immediately by calling 911 or calling your MD immediately  if symptoms less severe.  You Must read complete instructions/literature along with all the possible adverse reactions/side effects for all the Medicines you take and that have been prescribed to you. Take any new Medicines after you have completely understood and accpet all the possible adverse reactions/side effects.   Do not drive and provide baby sitting services if your were admitted for syncope or siezures until you have seen by Primary MD or a Neurologist and advised to do so again.  Do not drive when taking Pain medications.    Do not take more than prescribed Pain, Sleep and Anxiety Medications  Special Instructions: If you have smoked or chewed Tobacco  in the last 2 yrs please stop smoking, stop any regular Alcohol  and or any Recreational drug use.  Wear Seat belts while driving.   Please note  You were cared for by a hospitalist during your hospital stay. If you have any questions about your discharge medications or the care you received while you were in the hospital after you are discharged, you can call the unit and asked to speak with the hospitalist on call if the hospitalist that took care of you is not available. Once you are discharged, your primary care physician will handle any further medical issues. Please note that NO REFILLS for any discharge medications will be authorized once you are discharged, as it is imperative that you return to your primary care physician (or establish a relationship with a primary care physician if you do not have one) for your aftercare needs so that they can reassess your need for medications and monitor your lab values.    Increase activity slowly  As directed        Medication List    STOP taking these medications       aspirin 325 MG tablet     cephALEXin 250 MG capsule  Commonly known as:  KEFLEX      TAKE these medications       amLODipine 5 MG tablet  Commonly known as:  NORVASC  Take 5 mg by mouth daily before breakfast.     cholecalciferol 1000 UNITS tablet  Commonly known as:  VITAMIN D  Take 1,000 Units by mouth daily.     citalopram 10 MG tablet  Commonly known as:  CELEXA  Take  10 mg by mouth daily.     clopidogrel 75 MG tablet  Commonly known as:  PLAVIX  Take 1 tablet (75 mg total) by mouth daily with breakfast.     diphenhydramine-acetaminophen 25-500 MG Tabs  Commonly known as:  TYLENOL PM  Take 1 tablet by mouth at bedtime as needed.     divalproex 250 MG DR tablet  Commonly known as:  DEPAKOTE  Take 250 mg by mouth 2 (two) times daily.     galantamine 12 MG tablet  Commonly known as:  RAZADYNE  Take 12 mg by mouth 2 (two) times daily.     l-methylfolate-b2-b6-b12 11-12-48-5 MG Tabs  Commonly known as:  CEREFOLIN  Take 1 tablet by mouth daily.     memantine 10 MG tablet  Commonly known as:  NAMENDA  Take 10 mg by mouth 2 (two) times daily.     multivitamin tablet  Take 1 tablet by mouth daily.     simvastatin 40 MG tablet  Commonly known as:  ZOCOR  Take 40 mg by mouth every evening.     tamsulosin 0.4 MG Caps capsule  Commonly known as:  FLOMAX  Take 0.4 mg by mouth daily.     vitamin B-12 100 MCG tablet  Commonly known as:  CYANOCOBALAMIN  Take 100 mcg by mouth daily.     vitamin C 500 MG tablet  Commonly known as:  ASCORBIC ACID  Take 500 mg by mouth daily.          Diet and Activity recommendation: See Discharge Instructions above   Consults obtained - Neuro   Major procedures and Radiology Reports - PLEASE review detailed and final reports for all details, in brief -     TEE - no thrombi no PFO   Carotids  Bilateral: mild  calcific plaque origin ICA. 1-39% ICA stenosis. Vertebral artery flow is antegrade.     TTE  Left ventricle: The cavity size was normal. Wall thickness was increased in a pattern of mild LVH. Systolic function was normal. The estimated ejection fraction was in the range of 50% to 55%. Wall motion was normal; there were no regional wall motion abnormalities. Doppler parameters are consistent with abnormal left ventricular relaxation (grade 1 diastolic dysfunction).    Dg Chest 2 View  10/20/2013   CLINICAL DATA:  Stroke.  Current history of hypertension.  EXAM: CHEST  2 VIEW  COMPARISON:  DG CHEST 2 VIEW dated 11/10/2012; DG CHEST 2 VIEW dated 02/27/2004.  FINDINGS: Suboptimal inspiration accounts for crowded bronchovascular markings, especially in the bases, and accentuates the cardiac silhouette. Taking this into account, cardiac silhouette upper normal in size and stable. Thoracic aorta mildly atherosclerotic. Hilar and mediastinal contours otherwise unremarkable. 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 Chronic linear scar/atelectasis in the right middle lobe, unchanged. Lungs otherwise clear. No localized airspace consolidation. No pleural effusions. No pneumothorax. Normal pulmonary vascularity. Degenerative changes involving the thoracic spine.  IMPRESSION: Suboptimal inspiration. No acute cardiopulmonary disease. Chronic scar/atelectasis in the right middle lobe related to chronic eventration of the right anterior hemidiaphragm.   Electronically Signed   By: Evangeline Dakin M.D.   On: 10/20/2013 21:15   Ct Head Wo Contrast  10/20/2013   CLINICAL DATA:  Code stroke, right arm weakness  EXAM: CT HEAD WITHOUT CONTRAST  TECHNIQUE: Contiguous axial images were obtained from the base of the skull through the vertex without intravenous contrast.  COMPARISON:  MR 11/17/2010  FINDINGS: Retention cyst or polyp in the right frontal sinus.  Atherosclerotic and physiologic intracranial calcifications. Large lacunar infarct in the left basal ganglia and corona radiata with adjacent hypoattenuation in the adjacent white matter and some mild ex vacuo dilatation of the adjacent lateral ventricle. There is a medial left occipital lobe subacute or chronic infarct which was not conspicuous on the prior study. No acute intracranial hemorrhage, midline shift, mass, or mass effect. Moderate diffuse parenchymal atrophy. Mild patchy areas of hypoattenuation in deep and periventricular white matter bilaterally.  IMPRESSION: 1. Negative for bleed or other acute intracranial process. 2. Old left basal ganglia/corona radiata and left occipital infarcts. 3. Atrophy and nonspecific white matter changes. Critical Value/emergent results were called by telephone at the time of interpretation on 10/20/2013 at 2:49 PM to Dr. Doy Mince, who verbally acknowledged these results.   Electronically Signed   By: Arne Cleveland M.D.   On: 10/20/2013 14:49   Mr Jodene Nam Head Wo Contrast  10/21/2013   CLINICAL DATA:  Progressive right upper extremity weakness. Increased confusion. History of prior CVAs x 3.  EXAM: MRI HEAD WITHOUT CONTRAST  MRA HEAD WITHOUT CONTRAST  TECHNIQUE: Multiplanar, multiecho pulse sequences of the brain and surrounding structures were obtained without intravenous contrast. Angiographic images of the head were obtained using MRA technique without contrast.  COMPARISON:  CT head without contrast 10/20/2013. MRI brain 11/17/2010.  FINDINGS: MRI HEAD FINDINGS  The diffusion-weighted images demonstrate acute nonhemorrhagic infarcts extending through the posterior left frontal and parietal lobes within a watershed distribution. There additional foci of infarct extending more inferiorly in the left parietal lobe, potentially along the left MCA and PCA watershed territory.  A remote left MCA territory infarct involving the left frontal operculum is similar to the prior  exam. New white matter changes are associated with the acute/subacute infarct. Extensive white matter changes are otherwise similar to the prior study. Moderate generalized atrophy and periventricular white matter changes are present bilaterally. A lacunar infarct in the left thalamus is new since prior study but is not acute.  A medial left occipital lobe infarct is also new since the prior study, but not acute.  Flow is present in the major intracranial arteries. The globes and orbits are intact. A prominent polyp or mucous retention cyst in the right frontal sinus is slightly larger than on the prior exam. Mild mucosal thickening is present throughout the paranasal sinuses. There is some fluid in the mastoid air cells bilaterally. No obstructing nasopharyngeal lesion is evident.  MRA HEAD FINDINGS  The time-of-flight images are somewhat degraded by patient motion. The internal carotid arteries are within normal limits from the high cervical segments through the ICA termini. The A1 and M1 segments are intact. No definite anterior communicating artery is evident. The MCA bifurcations are intact. There is some attenuation of MCA branch vessels bilaterally.  The left vertebral artery is the dominant vessel. The PICA origins are not visualized of the there is poor signal over the proximal vertebral arteries, likely artifactual. The basilar artery is intact. The posterior cerebral arteries originate from the basilar tip. The left posterior cerebral artery is occluded. There is some attenuation of distal right PCA branch vessels.  IMPRESSION: 1. Acute nonhemorrhagic infarcts within the left frontal and parietal lobe appear to be within a watershed distribution. 2. Additional acute nonhemorrhagic infarcts within the left parietal and occipital lobe are likely watershed type infarcts as well. 3. Proximal occlusion of the left posterior cerebral artery. 4. Stable remote left  MCA territory infarct within the left frontal  operculum. 5. Medial left occipital lobe infarct and left thalamic infarcts are is new since the prior study but not acute. 6. Mild to moderate small vessel disease throughout the circle of Willis. These results were called by telephone at the time of interpretation on 10/21/2013 at 12:28 PM to Dr. Alexis Goodell , who verbally acknowledged these results.   Electronically Signed   By: Lawrence Santiago M.D.   On: 10/21/2013 12:34   Mr Brain Wo Contrast  10/21/2013   CLINICAL DATA:  Progressive right upper extremity weakness. Increased confusion. History of prior CVAs x 3.  EXAM: MRI HEAD WITHOUT CONTRAST  MRA HEAD WITHOUT CONTRAST  TECHNIQUE: Multiplanar, multiecho pulse sequences of the brain and surrounding structures were obtained without intravenous contrast. Angiographic images of the head were obtained using MRA technique without contrast.  COMPARISON:  CT head without contrast 10/20/2013. MRI brain 11/17/2010.  FINDINGS: MRI HEAD FINDINGS  The diffusion-weighted images demonstrate acute nonhemorrhagic infarcts extending through the posterior left frontal and parietal lobes within a watershed distribution. There additional foci of infarct extending more inferiorly in the left parietal lobe, potentially along the left MCA and PCA watershed territory.  A remote left MCA territory infarct involving the left frontal operculum is similar to the prior exam. New white matter changes are associated with the acute/subacute infarct. Extensive white matter changes are otherwise similar to the prior study. Moderate generalized atrophy and periventricular white matter changes are present bilaterally. A lacunar infarct in the left thalamus is new since prior study but is not acute.  A medial left occipital lobe infarct is also new since the prior study, but not acute.  Flow is present in the major intracranial arteries. The globes and orbits are intact. A prominent polyp or mucous retention cyst in the right frontal sinus  is slightly larger than on the prior exam. Mild mucosal thickening is present throughout the paranasal sinuses. There is some fluid in the mastoid air cells bilaterally. No obstructing nasopharyngeal lesion is evident.  MRA HEAD FINDINGS  The time-of-flight images are somewhat degraded by patient motion. The internal carotid arteries are within normal limits from the high cervical segments through the ICA termini. The A1 and M1 segments are intact. No definite anterior communicating artery is evident. The MCA bifurcations are intact. There is some attenuation of MCA branch vessels bilaterally.  The left vertebral artery is the dominant vessel. The PICA origins are not visualized of the there is poor signal over the proximal vertebral arteries, likely artifactual. The basilar artery is intact. The posterior cerebral arteries originate from the basilar tip. The left posterior cerebral artery is occluded. There is some attenuation of distal right PCA branch vessels.  IMPRESSION: 1. Acute nonhemorrhagic infarcts within the left frontal and parietal lobe appear to be within a watershed distribution. 2. Additional acute nonhemorrhagic infarcts within the left parietal and occipital lobe are likely watershed type infarcts as well. 3. Proximal occlusion of the left posterior cerebral artery. 4. Stable remote left MCA territory infarct within the left frontal operculum. 5. Medial left occipital lobe infarct and left thalamic infarcts are is new since the prior study but not acute. 6. Mild to moderate small vessel disease throughout the circle of Willis. These results were called by telephone at the time of interpretation on 10/21/2013 at 12:28 PM to Dr. Alexis Goodell , who verbally acknowledged these results.   Electronically Signed   By: Bretta Bang.D.  On: 10/21/2013 12:34    Micro Results      No results found for this or any previous visit (from the past 240 hour(s)).   History of present illness and   Hospital Course:     Kindly see H&P for history of present illness and admission details, please review complete Labs, Consult reports and Test reports for all details in brief Trevor Lutz, is a 80 y.o. male, patient with history of  advanced  dementia confused at baseline, hypertension, BPH, dyslipidemia, who was brought in from home for right-sided weakness.      1. CVA (cerebral infarction) with dense right-sided hemiparesis and expressive aphasia. He has had previous history of CVAs, was on aspirin and switched to Plavix by neurology, seen by PT-OT & speech, noted MRI MRA brain, stable TTE, TEE and carotid duplex. Will need rehabilitation for continued PT OT and speech assistance.      Lab Results   Component  Value  Date    HGBA1C  6.1*  10/21/2013    Lab Results   Component  Value  Date    CHOL  138  10/21/2013    HDL  44  10/21/2013    LDLCALC  78  10/21/2013    TRIG  80  10/21/2013    CHOLHDL  3.1  10/21/2013       2. Underlying Alzheimer's dementia. At risk for delirium he is confused at baseline per wife Horris Latino, we'll continue supportive care with Depakote along with Namenda.     3. BPH. Continue Flomax. Developed urinary retention on 10/23/2013, Foley placed, continue Flomax, outpatient followup with urology within 7-10 days.    4. Dyslipidemia. On home dose statin. Lipid panel stable.    5. HTN. Commence home regimen upon discharge.    Updated wife Horris Latino on 10/24/2048 9:40 AM about the plan, once but is available patient will be discharged.     Today   Subjective:   Leovardo Hargan today has no headache,no chest abdominal pain,no new weakness tingling or numbness, feels much better however is a poor historian as is pleasantly confused.   Objective:   Blood pressure 139/50, pulse 69, temperature 98.3 F (36.8 C), temperature source Oral, resp. rate 18, height 5\' 7"  (1.702 m), weight 74.702 kg (164 lb 11 oz), SpO2 95.00%.   Intake/Output Summary (Last 24  hours) at 10/24/13 0937 Last data filed at 10/24/13 0811  Gross per 24 hour  Intake    120 ml  Output    975 ml  Net   -855 ml    Exam Awake but pleasantly confused, Oriented x 0, No new F.N deficits continues to have  right-sided hemiparesis strength 4/5, Normal affect [place].AT,PERRAL Supple Neck,No JVD, No cervical lymphadenopathy appriciated.  Symmetrical Chest wall movement, Good air movement bilaterally, CTAB RRR,No Gallops,Rubs or new Murmurs, No Parasternal Heave +ve B.Sounds, Abd Soft, Non tender, No organomegaly appriciated, No rebound -guarding or rigidity. No Cyanosis, Clubbing or edema, No new Rash or bruise  Data Review   CBC w Diff: Lab Results  Component Value Date   WBC 5.1 10/20/2013   HGB 15.3 10/20/2013   HCT 45.0 10/20/2013   PLT 126* 10/20/2013   LYMPHOPCT 34 10/20/2013   MONOPCT 14* 10/20/2013   EOSPCT 6* 10/20/2013   BASOPCT 0 10/20/2013    CMP: Lab Results  Component Value Date   NA 141 10/20/2013   K 4.5 10/20/2013   CL 101 10/20/2013   CO2 25 10/20/2013  BUN 20 10/20/2013   CREATININE 1.30 10/20/2013   PROT 6.3 10/20/2013   ALBUMIN 3.2* 10/20/2013   BILITOT 0.3 10/20/2013   ALKPHOS 51 10/20/2013   AST 31 10/20/2013   ALT 19 10/20/2013  .   Total Time in preparing paper work, data evaluation and todays exam - 35 minutes  Thurnell Lose M.D on 10/24/2013 at 9:37 AM  [facility] Office  [phone]

## 2013-10-25 ENCOUNTER — Inpatient Hospital Stay (HOSPITAL_COMMUNITY): Payer: Medicare PPO

## 2013-10-25 DIAGNOSIS — I639 Cerebral infarction, unspecified: Secondary | ICD-10-CM

## 2013-10-25 HISTORY — DX: Cerebral infarction, unspecified: I63.9

## 2013-10-25 LAB — GLUCOSE, CAPILLARY
GLUCOSE-CAPILLARY: 103 mg/dL — AB (ref 70–99)
Glucose-Capillary: 113 mg/dL — ABNORMAL HIGH (ref 70–99)
Glucose-Capillary: 105 mg/dL — ABNORMAL HIGH (ref 70–99)

## 2013-10-25 MED ORDER — HYDROCOD POLST-CHLORPHEN POLST 10-8 MG/5ML PO LQCR
5.0000 mL | Freq: Once | ORAL | Status: AC
  Administered 2013-10-25: 5 mL via ORAL
  Filled 2013-10-25: qty 5

## 2013-10-25 MED ORDER — HYDROCOD POLST-CHLORPHEN POLST 10-8 MG/5ML PO LQCR: 5.0000 mL | Freq: Once | ORAL | Status: DC

## 2013-10-25 NOTE — Progress Notes (Signed)
Patient is discharged from room 4N11 at this time. Transported by [facility] in stable condition.

## 2013-10-25 NOTE — Progress Notes (Signed)
Patient is to be discharged from room 4N11 and transferred to [facility]. Report called in to nurse [facility]. Awaiting [facility] at this time to transport.

## 2013-10-25 NOTE — Progress Notes (Signed)
Patient Demographics  Trevor Lutz, is a 80 y.o. male, DOB - [birth year], WUJ:[id number]  Admit date - 10/20/2013   Admitting Physician Charlynne Cousins, MD  Outpatient Primary MD for the patient is Irven Shelling, MD  LOS - 5   Chief Complaint  Patient presents with  . Code Stroke        He has been discharged on 10/24/2013, here  pending bed availability     Assessment & Plan    1. 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 He has had previous history of CVAs, was on aspirin and switched to Plavix by neurology, seen by PT-OT & speech, noted MRI MRA brain, stable TTE and carotid duplex. Stable TEE, of note right-sided weakness has mildly improved now.       Lab Results  Component Value Date   HGBA1C 6.1* 10/21/2013    Lab Results  Component Value Date   CHOL 138 10/21/2013   HDL 44 10/21/2013   LDLCALC 78 10/21/2013   TRIG 80 10/21/2013   CHOLHDL 3.1 10/21/2013     2. Underlying Alzheimer's dementia. At risk for delirium, we'll continue supportive care with Depakote along with Namenda.    3. BPH. Continue Flomax. Developed urinary retention on 10/23/2013, if continues to retain Urine Will place Foley.    4. Dyslipidemia. On home dose statin. Lipid panel stable.    5. HTN. Allowing for permissive hypertension. As needed IV hydralazine.       Code Status: DNR now  Family Communication:    Updated wife Horris Latino and brother-in-law who is a physician on 10/21/2013 @ 6 PM.   Called and left message for wife Horris Latino on 10/23/2013 11 AM  Talked to Horris Latino 10-23-13 @ 12.15 pm her Cell [phone] (she was in the rm)    Disposition Plan: SNF   Procedures CT head, MRI brain, MRA head, echo, carotid duplex, TEE   Consults   Neuro   Medications  Scheduled Meds: . citalopram  10 mg Oral Daily  . clopidogrel  75 mg Oral Q breakfast  . divalproex  250 mg Oral BID  . galantamine  12 mg Oral BID WC  . heparin  5,000 Units Subcutaneous 3 times per day  . insulin aspart  0-9 Units Subcutaneous TID WC  . memantine  10 mg Oral BID  . simvastatin  40 mg Oral QPM  . tamsulosin  0.4 mg Oral QHS  . vitamin B-12  100 mcg Oral Daily   Continuous Infusions:   PRN Meds:.acetaminophen, haloperidol lactate, senna-docusate  DVT Prophylaxis   heparin  Lab Results  Component Value Date   PLT 126* 10/20/2013    Antibiotics     Anti-infectives   None          Subjective:   Massimiliano Phenix today has, No headache, No chest pain, No abdominal pain - No Nausea, No new weakness tingling or numbness, No Cough - SOB.   Objective:   Filed Vitals:   10/24/13 2000 10/24/13 2143 10/25/13 0000 10/25/13 0136  BP:  129/80  137/85  Pulse:  66  70  Temp:  97.9 F (36.6 C)  98.5 F (36.9 C)  TempSrc:  Oral  Oral  Resp: 17 18 16  18  Height:      Weight:      SpO2:  95%  93%    Wt Readings from Last 3 Encounters:  10/20/13 74.702 kg (164 lb 11 oz)  10/20/13 74.702 kg (164 lb 11 oz)  10/20/13 74.702 kg (164 lb 11 oz)     Intake/Output Summary (Last 24 hours) at 10/25/13 0845 Last data filed at 10/25/13 0500  Gross per 24 hour  Intake      0 ml  Output    450 ml  Net   -450 ml     Physical Exam  Awake , not Alert, Oriented X 0,R side 4/5, Exp aphasia, less agitated [place].AT,PERRAL Supple Neck,No JVD, No cervical lymphadenopathy appriciated.  Symmetrical Chest wall movement, Good air movement bilaterally, CTAB RRR,No Gallops,Rubs or new Murmurs, No Parasternal Heave +ve B.Sounds, Abd Soft, Non tender, No organomegaly appriciated, No rebound - guarding or rigidity. Foley in place No Cyanosis, Clubbing or edema, No new Rash or bruise      Data Review   Micro Results No results found for this or any  previous visit (from the past 240 hour(s)).  Radiology Reports  Carotids  Bilateral: mild calcific plaque origin ICA. 1-39% ICA stenosis. Vertebral artery flow is antegrade.   TTE  Left ventricle: The cavity size was normal. Wall thickness was increased in a pattern of mild LVH. Systolic function was normal. The estimated ejection fraction was in the range of 50% to 55%. Wall motion was normal; there were no regional wall motion abnormalities. Doppler parameters are consistent with abnormal left ventricular relaxation (grade 1 diastolic dysfunction).    TEE      Dg Chest 2 View  10/20/2013   CLINICAL DATA:  Stroke.  Current history of hypertension.  EXAM: CHEST  2 VIEW  COMPARISON:  DG CHEST 2 VIEW dated 11/10/2012; DG CHEST 2 VIEW dated 02/27/2004.  FINDINGS: Suboptimal inspiration accounts for crowded bronchovascular markings, especially in the bases, and accentuates the cardiac silhouette. Taking this into account, cardiac silhouette upper normal in size and stable. Thoracic aorta mildly atherosclerotic. Hilar and mediastinal contours otherwise unremarkable. 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 Chronic linear scar/atelectasis in the right middle lobe, unchanged. Lungs otherwise clear. No localized airspace consolidation. No pleural effusions. No pneumothorax. Normal pulmonary vascularity. Degenerative changes involving the thoracic spine.  IMPRESSION: Suboptimal inspiration. No acute cardiopulmonary disease. Chronic scar/atelectasis in the right middle lobe related to chronic eventration of the right anterior hemidiaphragm.   Electronically Signed   By: Evangeline Dakin M.D.   On: 10/20/2013 21:15   Ct Head Wo Contrast  10/20/2013   CLINICAL DATA:  Code stroke, right arm weakness  EXAM: CT HEAD WITHOUT CONTRAST  TECHNIQUE: Contiguous axial images were obtained from the base of the skull through the vertex without  intravenous contrast.  COMPARISON:  MR 11/17/2010  FINDINGS: Retention cyst or polyp in the right frontal sinus. Atherosclerotic and physiologic intracranial calcifications. Large lacunar infarct in the left basal ganglia and corona radiata with adjacent hypoattenuation in the adjacent white matter and some mild ex vacuo dilatation of the adjacent lateral ventricle. There is a medial left occipital lobe subacute or chronic infarct which was not conspicuous on the prior study. No acute intracranial hemorrhage, midline shift, mass, or mass effect. Moderate diffuse parenchymal atrophy. Mild patchy areas of hypoattenuation in deep and periventricular white matter bilaterally.  IMPRESSION: 1. Negative for bleed or other acute intracranial process.  2. Old left basal ganglia/corona radiata and left occipital infarcts. 3. Atrophy and nonspecific white matter changes. Critical Value/emergent results were called by telephone at the time of interpretation on 10/20/2013 at 2:49 PM to Dr. Doy Mince, who verbally acknowledged these results.   Electronically Signed   By: Arne Cleveland M.D.   On: 10/20/2013 14:49    CBC  Recent Labs Lab 10/20/13 1447 10/20/13 1456  WBC 5.1  --   HGB 14.7 15.3  HCT 43.7 45.0  PLT 126*  --   MCV 95.2  --   MCH 32.0  --   MCHC 33.6  --   RDW 14.4  --   LYMPHSABS 1.7  --   MONOABS 0.7  --   EOSABS 0.3  --   BASOSABS 0.0  --     Chemistries   Recent Labs Lab 10/20/13 1447 10/20/13 1456  NA 139 141  K 5.0 4.5  CL 102 101  CO2 25  --   GLUCOSE 98 103*  BUN 19 20  CREATININE 1.05 1.30  CALCIUM 8.4  --   AST 31  --   ALT 19  --   ALKPHOS 51  --   BILITOT 0.3  --    ------------------------------------------------------------------------------------------------------------------ estimated creatinine clearance is 36 ml/min (by C-G formula based on Cr of  1.3). ------------------------------------------------------------------------------------------------------------------ No results found for this basename: HGBA1C,  in the last 72 hours ------------------------------------------------------------------------------------------------------------------ No results found for this basename: CHOL, HDL, LDLCALC, TRIG, CHOLHDL, LDLDIRECT,  in the last 72 hours ------------------------------------------------------------------------------------------------------------------ No results found for this basename: TSH, T4TOTAL, FREET3, T3FREE, THYROIDAB,  in the last 72 hours ------------------------------------------------------------------------------------------------------------------ No results found for this basename: VITAMINB12, FOLATE, FERRITIN, TIBC, IRON, RETICCTPCT,  in the last 72 hours  Coagulation profile  Recent Labs Lab 10/20/13 1447  INR 1.03    No results found for this basename: DDIMER,  in the last 72 hours  Cardiac Enzymes No results found for this basename: CK, CKMB, TROPONINI, MYOGLOBIN,  in the last 168 hours ------------------------------------------------------------------------------------------------------------------ No components found with this basename: POCBNP,      Time Spent in minutes   35   Thurnell Lose M.D on 10/25/2013 at 8:45 AM  Between 7am to 7pm - Pager - [phone]  After 7pm go to [email or web address] - password [email or web address]  And look for the night coverage person covering for me after hours  [facility] Office  [phone]

## 2013-10-25 NOTE — Progress Notes (Signed)
  CSW received a call from [place] at [facility] and Pt did receive authorization for placement.   CSW also spoke with Lenna Sciara who is a Tourist information centre manager for the Pt and she wanted to contact the [facility] to see if the assistant can ride in the ambulance with the Pt. CSW provided the number for [facility] to see if special accomodations can be made for the Pt.   CSW will continue to follow Pt for d/c planning.    [facility]  4N 1[address]16;  [phone] Phone: [phone]

## 2013-10-25 NOTE — Progress Notes (Signed)
  CSW spoke with Ivin Booty at [facility] and she is contacting the insurance company to see if a determination has been made on the authorization.   Ivin Booty will contact CSW back when authorization is received.   [facility]  4N 1[address]16;  [phone] Phone: [phone]

## 2013-10-25 NOTE — Progress Notes (Addendum)
   Pt to be d/c today to [facility].   Pt and family agreeable. Confirmed plans with facility.  Plan transfer via EMS.    [facility]  4N 1[address]16;  [phone] Phone: [phone]

## 2013-10-26 ENCOUNTER — Non-Acute Institutional Stay (SKILLED_NURSING_FACILITY): Payer: Medicare PPO | Admitting: Adult Health

## 2013-10-26 ENCOUNTER — Encounter: Payer: Self-pay | Admitting: Adult Health

## 2013-10-26 DIAGNOSIS — I639 Cerebral infarction, unspecified: Secondary | ICD-10-CM

## 2013-10-26 DIAGNOSIS — I10 Essential (primary) hypertension: Secondary | ICD-10-CM

## 2013-10-26 DIAGNOSIS — N400 Enlarged prostate without lower urinary tract symptoms: Secondary | ICD-10-CM

## 2013-10-26 DIAGNOSIS — F32A Depression, unspecified: Secondary | ICD-10-CM

## 2013-10-26 DIAGNOSIS — F3289 Other specified depressive episodes: Secondary | ICD-10-CM

## 2013-10-26 DIAGNOSIS — I6350 Cerebral infarction due to unspecified occlusion or stenosis of unspecified cerebral artery: Secondary | ICD-10-CM

## 2013-10-26 DIAGNOSIS — F0390 Unspecified dementia without behavioral disturbance: Secondary | ICD-10-CM

## 2013-10-26 DIAGNOSIS — F329 Major depressive disorder, single episode, unspecified: Secondary | ICD-10-CM

## 2013-10-26 DIAGNOSIS — E785 Hyperlipidemia, unspecified: Secondary | ICD-10-CM

## 2013-10-26 NOTE — Progress Notes (Signed)
Patient ID: Trevor Lutz, male   DOB: [birth year], 80 y.o.   MRN: [id number]               PROGRESS NOTE  DATE: 10/26/2013  FACILITY: Nursing Home Location: [facility]  LEVEL OF CARE: SNF (31)  Acute Visit  CHIEF COMPLAINT:  Follow-up Hospitalization  HISTORY OF PRESENT ILLNESS: This is an 80 year old male who has been admitted to [facility] on 10/25/13 from [facility] with principal diagnosis of CVA (cerebral infarction). He has been admitted for a short-term rehabilitation.  REASSESSMENT OF ONGOING PROBLEM(S):  HTN: Pt 's HTN remains stable.  Denies CP, sob, DOE, pedal edema, headaches, dizziness or visual disturbances.  No complications from the medications currently being used.  Last BP : 124/76  DEMENTIA: The dementia remaines stable and continues to function adequately in the current living environment with supervision.  The patient has had little changes in behavior. No complications noted from the medications presently being used.  HYPERLIPIDEMIA: No complications from the medications presently being used. 5/15 fasting lipid panel showed : Cholesterol 138 triglycerides 80 HDL 44 LDL 70  PAST MEDICAL HISTORY : Reviewed.  No changes/see problem list  CURRENT MEDICATIONS: Reviewed per MAR/see medication list  REVIEW OF SYSTEMS:  GENERAL: no change in appetite, no fatigue, no weight changes, no fever, chills or weakness RESPIRATORY: no cough, SOB, DOE, wheezing, hemoptysis CARDIAC: no chest pain, edema or palpitations GI: no abdominal pain, diarrhea, constipation, heart burn, nausea or vomiting GU: +tenderness on suprapubic area NEURO:  Expressive aphasia  PHYSICAL EXAMINATION  GENERAL: no acute distress, normal body habitus EYES: conjunctivae normal, sclerae normal, normal eye lids NECK: supple, trachea midline, no neck masses, no thyroid tenderness, no thyromegaly LYMPHATICS: no LAN in the neck, no supraclavicular LAN RESPIRATORY:  breathing is even & unlabored, BS CTAB CARDIAC: RRR, no murmur,no extra heart sounds, no edema GI: abdomen soft, normal BS, no masses, no tenderness, no hepatomegaly, no splenomegaly GU: foley catheter noted has decreased output EXTREMITIES: Right sided weakness PSYCHIATRIC: the patient is alert & oriented to person, affect & behavior appropriate  LABS/RADIOLOGY: Labs reviewed: Basic Metabolic Panel:  Recent Labs  11/03/12 0749 11/10/12 1000 10/20/13 1447 10/20/13 1456  NA 141 140 139 141  K 4.9 4.9 5.0 4.5  CL 107 103 102 101  CO2  --  30 25  --   GLUCOSE 102* 123* 98 103*  BUN 21 15 19 20   CREATININE 1.10 1.10 1.05 1.30  CALCIUM  --  9.4 8.4  --    Liver Function Tests:  Recent Labs  10/20/13 1447  AST 31  ALT 19  ALKPHOS 51  BILITOT 0.3  PROT 6.3  ALBUMIN 3.2*   CBC:  Recent Labs  11/03/12 0749 11/10/12 1000 10/20/13 1447 10/20/13 1456  WBC  --  6.7 5.1  --   NEUTROABS  --   --  2.4  --   HGB 12.9* 15.7 14.7 15.3  HCT 38.0* 45.8 43.7 45.0  MCV  --  93.5 95.2  --   PLT  --  172 126*  --    Lipid Panel:  Recent Labs  10/21/13 0515  HDL 44   CBG:  Recent Labs  10/25/13 0655 10/25/13 1149 10/25/13 1857  GLUCAP 103* 113* 105*    ASSESSMENT/PLAN:  CVA (cerebral infarct) - for rehabilitation Dementia - continue Depakote, Namenda, and Razadyne Hypertension - well controlled; continue Norvasc BPH - continue Flomax; was sent to urologist  today due to urinary retention despite intact foley catheter Hyperlipidemia - continue Zocor Depression - continue Celexa    CPT CODE: 36644  Monina Vargas - NP [facility] [phone]

## 2013-10-29 ENCOUNTER — Encounter (HOSPITAL_COMMUNITY): Payer: Self-pay | Admitting: *Deleted

## 2013-10-30 ENCOUNTER — Non-Acute Institutional Stay (SKILLED_NURSING_FACILITY): Payer: Medicare PPO | Admitting: Internal Medicine

## 2013-10-30 DIAGNOSIS — N400 Enlarged prostate without lower urinary tract symptoms: Secondary | ICD-10-CM

## 2013-10-30 DIAGNOSIS — I6350 Cerebral infarction due to unspecified occlusion or stenosis of unspecified cerebral artery: Secondary | ICD-10-CM

## 2013-10-30 DIAGNOSIS — F0390 Unspecified dementia without behavioral disturbance: Secondary | ICD-10-CM

## 2013-10-30 DIAGNOSIS — I639 Cerebral infarction, unspecified: Secondary | ICD-10-CM

## 2013-10-30 DIAGNOSIS — I10 Essential (primary) hypertension: Secondary | ICD-10-CM

## 2013-10-30 NOTE — Progress Notes (Signed)
HISTORY & PHYSICAL  DATE: 10/30/2013   FACILITY: [facility]  LEVEL OF CARE: SNF (31)  ALLERGIES: CVA: No Known Allergies  CHIEF COMPLAINT:  Manage CVA, dementia and BPH  HISTORY OF PRESENT ILLNESS: 80 year old Caucasian male was hospitalized secondary to right-sided weakness. After hospitalization he admitted to this facility for short-term rehabilitation.  CVA: The patient's CVA remains stable.  Staff deny new neurologic symptoms such as numbness, tingling, or visual disturbances.  No complications reported from the medications currently being used. Patient has dense right-sided hemiparesis and expressive aphasia.  DEMENTIA: The dementia remaines stable and continues to function adequately in the current living environment with supervision.  The patient has had little changes in behavior. No complications noted from the medications presently being used. Patient is a poor historian.  BPH: The patient's BPH remains stable. Patient currently has a foley. No complications reported from the current medications being used.  PAST MEDICAL HISTORY :  Past Medical History  Diagnosis Date  . Hypertension   . BPH (benign prostatic hyperplasia)     HAS INDWELLING FOLEY CATHETER  . Alzheimer disease   . Hyperlipidemia   . Erectile dysfunction   . Mild depression   . Spinal stenosis   . CVA (cerebral vascular accident)     PAST SURGICAL HISTORY: Past Surgical History  Procedure Laterality Date  . Kidney surgery      right kidney removed  . Tonsilectomy, adenoidectomy, bilateral myringotomy and tubes    . Hernia repair Left 09/24/1996    Indirect, Dr Lucia Gaskins surgeon  . Transurethral resection of prostate N/A 11/13/2012    Procedure: BI POLAR TRANSURETHRAL RESECTION OF THE PROSTATE (TURP);  Surgeon: Fredricka Bonine, MD;  Location: [facility];  Service: Urology;  Laterality: N/A;  . Tee without cardioversion N/A 10/23/2013    Procedure: TRANSESOPHAGEAL  ECHOCARDIOGRAM (TEE);  Surgeon: Thayer Headings, MD;  Location: [facility] ENDOSCOPY;  Service: Cardiovascular;  Laterality: N/A;  . Loop recorder implant  10-24-2013    MDT LinQ implanted by Dr Caryl Comes for cryptogenic stroke    SOCIAL HISTORY:  reports that he has quit smoking. His smoking use included Cigarettes. He smoked 0.00 packs per day. He does not have any smokeless tobacco history on file. He reports that he drinks alcohol. He reports that he does not use illicit drugs.  FAMILY HISTORY:  Family History  Problem Relation Age of Onset  . Heart disease Father   . Heart disease Mother     CURRENT MEDICATIONS: Reviewed per MAR/see medication list  REVIEW OF SYSTEMS: Unobtainable due to dementia  PHYSICAL EXAMINATION  VS:  See VS section  GENERAL: no acute distress, normal body habitus EYES: conjunctivae normal, sclerae normal, normal eye lids MOUTH/THROAT: lips without lesions,no lesions in the mouth,tongue is without lesions,uvula elevates in midline NECK: supple, trachea midline, no neck masses, no thyroid tenderness, no thyromegaly LYMPHATICS: no LAN in the neck, no supraclavicular LAN RESPIRATORY: breathing is even & unlabored, BS CTAB CARDIAC: RRR, no murmur,no extra heart sounds, no edema GI:  ABDOMEN: abdomen soft, normal BS, no masses, no tenderness  LIVER/SPLEEN: no hepatomegaly, no splenomegaly GU: Right groin has a soft mass, nontender MUSCULOSKELETAL: HEAD: normal to inspection & palpation BACK: no kyphosis, scoliosis or spinal processes tenderness EXTREMITIES: LEFT UPPER EXTREMITY: full range of motion, normal strength & tone RIGHT UPPER EXTREMITY:  Weakness LEFT LOWER EXTREMITY:  Unable to assess RIGHT LOWER EXTREMITY:  Unable to perform due to  hemiparesis PSYCHIATRIC: the patient is alert & oriented to person, affect & behavior appropriate  LABS/RADIOLOGY:  Labs reviewed: Basic Metabolic Panel:  Recent Labs  11/03/12 0749 11/10/12 1000 10/20/13 1447  10/20/13 1456  NA 141 140 139 141  K 4.9 4.9 5.0 4.5  CL 107 103 102 101  CO2  --  30 25  --   GLUCOSE 102* 123* 98 103*  BUN 21 15 19 20   CREATININE 1.10 1.10 1.05 1.30  CALCIUM  --  9.4 8.4  --    Liver Function Tests:  Recent Labs  10/20/13 1447  AST 31  ALT 19  ALKPHOS 51  BILITOT 0.3  PROT 6.3  ALBUMIN 3.2*   CBC:  Recent Labs  11/03/12 0749 11/10/12 1000 10/20/13 1447 10/20/13 1456  WBC  --  6.7 5.1  --   NEUTROABS  --   --  2.4  --   HGB 12.9* 15.7 14.7 15.3  HCT 38.0* 45.8 43.7 45.0  MCV  --  93.5 95.2  --   PLT  --  172 126*  --    Lipid Panel:  Recent Labs  10/21/13 0515  HDL 44   CBG:  Recent Labs  10/25/13 0655 10/25/13 1149 10/25/13 1857  GLUCAP 103* 113* 105*    Noninvasive Vascular Lab  Carotid Duplex Study  Patient:    Jatorian, Renault MR #:       [id number] Study Date: 10/22/2013 Gender:     M Age:        67 Height: Weight: BSA: Pt. Status: Room:       4N11C    SONOGRAPHER  Sharion Dove, RVS  ATTENDING    Lala Lund  ADMITTING    Richardean Sale     Antonietta Breach    Charlynne Cousins Reports also to:  ------------------------------------------------------------ History and indications:  Indications  CVA/Stroke. Right sided weakness.  History Diagnostic evaluation.  Risk factors:  Former tobacco use. Hypertension. Hyperlipidemia.  ------------------------------------------------------------ Study information:  Study status:  Routine.  Procedure:  A vascular evaluation was performed with the patient in the supine position. Image quality was adequate. The right CCA, right ECA, right ICA, right vertebral, left CCA, left ECA, left ICA, and left vertebral arteries were examined.    Carotid duplex study.   Carotid Duplex exam including 2D imaging, color and spectral Doppler were performed on the extracranial carotid and vertebral arteries using standard established  protocols.  Location:  Bedside.  Patient status:  Inpatient.  Arterial flow:  +--------------------+-------+-------+ Location            V sys  V ed    +--------------------+-------+-------+ Right CCA - proximal71cm/s 9cm/s   +--------------------+-------+-------+ Right CCA - distal  57cm/s 7cm/s   +--------------------+-------+-------+ Right ECA           -88cm/s-4cm/s  +--------------------+-------+-------+ Right ICA - proximal-37cm/s-9cm/s  +--------------------+-------+-------+ Right ICA - distal  -46cm/s-12cm/s +--------------------+-------+-------+ Right vertebral     -62cm/s-9cm/s  +--------------------+-------+-------+ Left CCA - proximal 85cm/s 12cm/s  +--------------------+-------+-------+ Left CCA - distal   -67cm/s-9cm/s  +--------------------+-------+-------+ Left ECA            -63cm/s-7cm/s  +--------------------+-------+-------+ Left ICA - proximal 46cm/s 11cm/s  +--------------------+-------+-------+ Left ICA - mid      -71cm/s-21cm/s +--------------------+-------+-------+ Left ICA - distal   -55cm/s-14cm/s +--------------------+-------+-------+ Left vertebral      65cm/s 13cm/s  +--------------------+-------+-------+  ------------------------------------------------------------ Summary: Bilateral: mild calcific plaque origin ICA. 1-39% ICA stenosis.  Vertebral artery flow is antegrade.  Transthoracic Echocardiography  Patient:    Dymond, Eye MR #:       [id number] Study Date: 10/22/2013 Gender:     M Age:        4 Height:     170.2cm Weight:     74.7kg BSA:        1.56m^2 Pt. Status: Room:       4N11C    SONOGRAPHER  Sharion Dove, RVS  SONOGRAPHER  Delman Kitten, RCS  ATTENDING    Lala Lund  ADMITTING    Richardean Sale     Charlynne Cousins  REFERRING    Charlynne Cousins  PERFORMING   [facility],  Inpatient cc:  ------------------------------------------------------------ LV EF: 50% -   55%  ------------------------------------------------------------ Indications:      CVA 436.  ------------------------------------------------------------ History:   Risk factors:  Former tobacco use. Hypertension. Dyslipidemia.  ------------------------------------------------------------ Study Conclusions  Left ventricle: The cavity size was normal. Wall thickness was increased in a pattern of mild LVH. Systolic function was normal. The estimated ejection fraction was in the range of 50% to 55%. Wall motion was normal; there were no regional wall motion abnormalities. Doppler parameters are consistent with abnormal left ventricular relaxation (grade 1 diastolic dysfunction).  Impressions:  - No cardiac source of emboli was indentified. Transthoracic echocardiography.  M-mode, complete 2D, spectral Doppler, and color Doppler.  Height:  Height: 170.2cm. Height: 67in.  Weight:  Weight: 74.7kg. Weight: 164.3lb.  Body mass index:  BMI: 25.8kg/m^2.  Body surface area:    BSA: 1.31m^2.  Blood pressure:     134/77.  Patient status:  Inpatient.  Location:  Echo laboratory.  ------------------------------------------------------------  ------------------------------------------------------------ Left ventricle:  The cavity size was normal. Wall thickness was increased in a pattern of mild LVH. Systolic function was normal. The estimated ejection fraction was in the range of 50% to 55%. Wall motion was normal; there were no regional wall motion abnormalities. Doppler parameters are consistent with abnormal left ventricular relaxation (grade 1 diastolic dysfunction).  ------------------------------------------------------------ Aortic valve:   Mildly thickened leaflets. Cusp separation was normal.  Doppler:  Transvalvular velocity was within the normal range. There was no stenosis.  No  regurgitation.  ------------------------------------------------------------ Aorta:  The aorta was normal, not dilated, and non-diseased.   ------------------------------------------------------------ Mitral valve:   Mildly thickened leaflets . Leaflet separation was normal.  Doppler:  Transvalvular velocity was within the normal range. There was no evidence for stenosis.  No regurgitation.  ------------------------------------------------------------ Left atrium:  The atrium was normal in size.  ------------------------------------------------------------ Atrial septum:  Poorly visualized.  ------------------------------------------------------------ Right ventricle:  The cavity size was normal. Wall thickness was normal. Systolic function was normal.  ------------------------------------------------------------ Pulmonic valve:    The valve appears to be grossly normal.  Doppler:   No significant regurgitation.  ------------------------------------------------------------ Tricuspid valve:   Structurally normal valve.   Leaflet separation was normal.  Doppler:  Transvalvular velocity was within the normal range.  No regurgitation.  ------------------------------------------------------------ Right atrium:  The atrium was normal in size.  ------------------------------------------------------------ Pericardium:  There was no pericardial effusion.   ------------------------------------------------------------ Post procedure conclusions Ascending Aorta:  - The aorta was normal, not dilated, and non-diseased.  ------------------------------------------------------------  2D measurements        Normal  Doppler               Normal Left ventricle  measurements LVID ED,   28.2 mm     43-52   Left ventricle chord,                         Ea, lat      7.68 cm/ ------- PLAX                           ann, tiss         s LVID ES,   22.2 mm     23-38   DP chord,                          E/Ea, lat    8.23     ------- PLAX                           ann, tiss FS, chord,   21 %      >29     DP PLAX                           Ea, med      4.72 cm/ ------- LVPW, ED     12 mm     ------  ann, tiss         s IVS/LVPW   0.86        <1.3    DP ratio, ED                      E/Ea, med   13.39     ------- Ventricular septum             ann, tiss IVS, ED    10.3 mm     ------  DP Aorta                          Mitral valve Root diam,   33 mm     ------  Peak E vel   63.2 cm/ ------- ED                                               s Left atrium                    Peak A vel    102 cm/ ------- AP dim       36 mm     ------                    s AP dim      1.9 cm/m^2 <2.2    Deceleratio   313 ms  150-230 index                          n time                                Peak E/A      0.6     -------  ratio   Transesophageal Echocardiography  Patient:    Trashawn, Oquendo MR #:       [id number] Study Date: 10/23/2013 Gender:     M Age:        81 Height:     170.2cm Weight:     74.5kg BSA:        1.18m^2 Pt. Status: Room:       4N11C    PERFORMING   Nahser, Philip  SONOGRAPHER  Sharion Dove, [place]  SONOGRAPHER  Florentina Jenny, RDCS  ORDERING     Barrett, Rhonda  REFERRING    Barrett, Rhonda  ATTENDING    [place], [place]  ADMITTING    Charlynne Cousins cc:  ------------------------------------------------------------ LV EF: 50% -   55%  ------------------------------------------------------------ Indications:      CVA 436.  ------------------------------------------------------------ Study Conclusions  - Left ventricle: Systolic function was normal. The   estimated ejection fraction was in the range of 50% to   55%. - Aortic valve: No evidence of vegetation. - Mitral valve: Mild regurgitation. - Left atrium: No evidence of thrombus in the atrial cavity   or appendage. - Atrial septum: No defect or patent  foramen ovale was   identified. Transesophageal echocardiography.  2D and color Doppler. Height:  Height: 170.2cm. Height: 67in.  Weight:  Weight: 74.5kg. Weight: 164lb.  Body mass index:  BMI: 25.7kg/m^2. Body surface area:    BSA: 1.59m^2.  Blood pressure: 167/90.  Patient status:  Inpatient.  Location:  Endoscopy.   ------------------------------------------------------------  ------------------------------------------------------------ Left ventricle:  Systolic function was normal. The estimated ejection fraction was in the range of 50% to 55%.  ------------------------------------------------------------ Aortic valve:   Structurally normal valve.   Cusp separation was normal.  No evidence of vegetation.  Doppler:   No regurgitation.  ------------------------------------------------------------ Mitral valve:   Doppler:   Mild regurgitation.  ------------------------------------------------------------ Left atrium:   No evidence of thrombus in the atrial cavity or appendage.  ------------------------------------------------------------ Atrial septum:  No ASD or PFO by color duplex or bubble study. No defect or patent foramen ovale was identified CT HEAD WITHOUT CONTRAST   TECHNIQUE: Contiguous axial images were obtained from the base of the skull through the vertex without intravenous contrast.   COMPARISON:  MR 11/17/2010   FINDINGS: Retention cyst or polyp in the right frontal sinus. Atherosclerotic and physiologic intracranial calcifications. Large lacunar infarct in the left basal ganglia and corona radiata with adjacent hypoattenuation in the adjacent white matter and some mild ex vacuo dilatation of the adjacent lateral ventricle. There is a medial left occipital lobe subacute or chronic infarct which was not conspicuous on the prior study. No acute intracranial hemorrhage, midline shift, mass, or mass effect. Moderate diffuse parenchymal atrophy. Mild patchy  areas of hypoattenuation in deep and periventricular white matter bilaterally.   IMPRESSION: 1. Negative for bleed or other acute intracranial process. 2. Old left basal ganglia/corona radiata and left occipital infarcts. 3. Atrophy and nonspecific white matter changes. MRI HEAD WITHOUT CONTRAST   MRA HEAD WITHOUT CONTRAST   TECHNIQUE: Multiplanar, multiecho pulse sequences of the brain and surrounding structures were obtained without intravenous contrast. Angiographic images of the head were obtained using MRA technique without contrast.   COMPARISON:  CT head without contrast 10/20/2013. MRI brain 11/17/2010.   FINDINGS: MRI HEAD FINDINGS   The diffusion-weighted images demonstrate acute nonhemorrhagic infarcts extending through the posterior left frontal and parietal lobes within a watershed distribution. There additional foci of infarct extending more inferiorly in the  left parietal lobe, potentially along the left MCA and PCA watershed territory.   A remote left MCA territory infarct involving the left frontal operculum is similar to the prior exam. New white matter changes are associated with the acute/subacute infarct. Extensive white matter changes are otherwise similar to the prior study. Moderate generalized atrophy and periventricular white matter changes are present bilaterally. A lacunar infarct in the left thalamus is new since prior study but is not acute.   A medial left occipital lobe infarct is also new since the prior study, but not acute.   Flow is present in the major intracranial arteries. The globes and orbits are intact. A prominent polyp or mucous retention cyst in the right frontal sinus is slightly larger than on the prior exam. Mild mucosal thickening is present throughout the paranasal sinuses. There is some fluid in the mastoid air cells bilaterally. No obstructing nasopharyngeal lesion is evident.   MRA HEAD FINDINGS   The  time-of-flight images are somewhat degraded by patient motion. The internal carotid arteries are within normal limits from the high cervical segments through the ICA termini. The A1 and M1 segments are intact. No definite anterior communicating artery is evident. The MCA bifurcations are intact. There is some attenuation of MCA branch vessels bilaterally.   The left vertebral artery is the dominant vessel. The PICA origins are not visualized of the there is poor signal over the proximal vertebral arteries, likely artifactual. The basilar artery is intact. The posterior cerebral arteries originate from the basilar tip. The left posterior cerebral artery is occluded. There is some attenuation of distal right PCA branch vessels.   IMPRESSION: 1. Acute nonhemorrhagic infarcts within the left frontal and parietal lobe appear to be within a watershed distribution. 2. Additional acute nonhemorrhagic infarcts within the left parietal and occipital lobe are likely watershed type infarcts as well. 3. Proximal occlusion of the left posterior cerebral artery. 4. Stable remote left MCA territory infarct within the left frontal operculum. 5. Medial left occipital lobe infarct and left thalamic infarcts are is new since the prior study but not acute. 6. Mild to moderate small vessel disease throughout the circle of Willis. PORTABLE CHEST - 1 VIEW   COMPARISON:  DG CHEST 2 VIEW dated 10/20/2013   FINDINGS: The lung volumes remain low. Patchy density just above the right hemidiaphragm is again demonstrated and consistent with atelectasis. The cardiac silhouette is normal in size. The pulmonary vascularity is not engorged. There is no pleural effusion or pneumothorax. Old deformities of the anterior aspects of the fifth and sixth ribs on the left is demonstrated.   IMPRESSION: Allowing for differences in positioning there has not been significant interval change in the appearance of the chest  since the earlier study.    ASSESSMENT/PLAN:  CVA-patient has right-sided hemiparesis and expressive aphasia Dementia-advanced BPH-continue Flomax Hypertension-well-controlled Right groin mass-likely fat tissue. Obtain ultrasound. Check CBC and CMP  I have reviewed patient's medical records received at admission/from hospitalization.  CPT CODE: 73710  Serafino Burciaga Y Zillah Alexie, [place] [phone]

## 2013-11-02 ENCOUNTER — Observation Stay (HOSPITAL_COMMUNITY)
Admission: EM | Admit: 2013-11-02 | Discharge: 2013-11-03 | Disposition: A | Discharge status: Skilled Nursing Facility | Payer: Medicare PPO | Attending: Internal Medicine | Admitting: Internal Medicine

## 2013-11-02 ENCOUNTER — Telehealth (HOSPITAL_BASED_OUTPATIENT_CLINIC_OR_DEPARTMENT_OTHER): Payer: Self-pay

## 2013-11-02 ENCOUNTER — Encounter (HOSPITAL_COMMUNITY): Payer: Self-pay | Admitting: Emergency Medicine

## 2013-11-02 ENCOUNTER — Emergency Department (HOSPITAL_COMMUNITY): Payer: Medicare PPO

## 2013-11-02 DIAGNOSIS — N400 Enlarged prostate without lower urinary tract symptoms: Secondary | ICD-10-CM

## 2013-11-02 DIAGNOSIS — N401 Enlarged prostate with lower urinary tract symptoms: Secondary | ICD-10-CM

## 2013-11-02 DIAGNOSIS — F329 Major depressive disorder, single episode, unspecified: Secondary | ICD-10-CM

## 2013-11-02 DIAGNOSIS — R079 Chest pain, unspecified: Principal | ICD-10-CM | POA: Insufficient documentation

## 2013-11-02 DIAGNOSIS — M4800 Spinal stenosis, site unspecified: Secondary | ICD-10-CM | POA: Insufficient documentation

## 2013-11-02 DIAGNOSIS — Z8673 Personal history of transient ischemic attack (TIA), and cerebral infarction without residual deficits: Secondary | ICD-10-CM | POA: Insufficient documentation

## 2013-11-02 DIAGNOSIS — Z87891 Personal history of nicotine dependence: Secondary | ICD-10-CM | POA: Insufficient documentation

## 2013-11-02 DIAGNOSIS — R339 Retention of urine, unspecified: Secondary | ICD-10-CM | POA: Insufficient documentation

## 2013-11-02 DIAGNOSIS — F0280 Dementia in other diseases classified elsewhere without behavioral disturbance: Secondary | ICD-10-CM | POA: Insufficient documentation

## 2013-11-02 DIAGNOSIS — I10 Essential (primary) hypertension: Secondary | ICD-10-CM | POA: Insufficient documentation

## 2013-11-02 DIAGNOSIS — N138 Other obstructive and reflux uropathy: Secondary | ICD-10-CM | POA: Insufficient documentation

## 2013-11-02 DIAGNOSIS — I639 Cerebral infarction, unspecified: Secondary | ICD-10-CM | POA: Diagnosis present

## 2013-11-02 DIAGNOSIS — N390 Urinary tract infection, site not specified: Secondary | ICD-10-CM | POA: Insufficient documentation

## 2013-11-02 DIAGNOSIS — I6789 Other cerebrovascular disease: Secondary | ICD-10-CM

## 2013-11-02 DIAGNOSIS — F0390 Unspecified dementia without behavioral disturbance: Secondary | ICD-10-CM | POA: Diagnosis present

## 2013-11-02 DIAGNOSIS — N529 Male erectile dysfunction, unspecified: Secondary | ICD-10-CM | POA: Insufficient documentation

## 2013-11-02 DIAGNOSIS — G309 Alzheimer's disease, unspecified: Secondary | ICD-10-CM | POA: Insufficient documentation

## 2013-11-02 DIAGNOSIS — Z7902 Long term (current) use of antithrombotics/antiplatelets: Secondary | ICD-10-CM | POA: Insufficient documentation

## 2013-11-02 DIAGNOSIS — E785 Hyperlipidemia, unspecified: Secondary | ICD-10-CM | POA: Insufficient documentation

## 2013-11-02 DIAGNOSIS — K4090 Unilateral inguinal hernia, without obstruction or gangrene, not specified as recurrent: Secondary | ICD-10-CM

## 2013-11-02 DIAGNOSIS — I6350 Cerebral infarction due to unspecified occlusion or stenosis of unspecified cerebral artery: Secondary | ICD-10-CM

## 2013-11-02 DIAGNOSIS — F3289 Other specified depressive episodes: Secondary | ICD-10-CM | POA: Insufficient documentation

## 2013-11-02 DIAGNOSIS — F32A Depression, unspecified: Secondary | ICD-10-CM

## 2013-11-02 LAB — CBC WITH DIFFERENTIAL/PLATELET
BASOS PCT: 0 % (ref 0–1)
Basophils Absolute: 0 10*3/uL (ref 0.0–0.1)
EOS PCT: 6 % — AB (ref 0–5)
Eosinophils Absolute: 0.4 10*3/uL (ref 0.0–0.7)
HCT: 38.5 % — ABNORMAL LOW (ref 39.0–52.0)
HEMOGLOBIN: 13.1 g/dL (ref 13.0–17.0)
LYMPHS ABS: 1.2 10*3/uL (ref 0.7–4.0)
Lymphocytes Relative: 16 % (ref 12–46)
MCH: 32.1 pg (ref 26.0–34.0)
MCHC: 34 g/dL (ref 30.0–36.0)
MCV: 94.4 fL (ref 78.0–100.0)
MONO ABS: 0.6 10*3/uL (ref 0.1–1.0)
MONOS PCT: 9 % (ref 3–12)
Neutro Abs: 5.2 10*3/uL (ref 1.7–7.7)
Neutrophils Relative %: 69 % (ref 43–77)
Platelets: 188 10*3/uL (ref 150–400)
RBC: 4.08 MIL/uL — AB (ref 4.22–5.81)
RDW: 14.5 % (ref 11.5–15.5)
WBC: 7.4 10*3/uL (ref 4.0–10.5)

## 2013-11-02 LAB — COMPREHENSIVE METABOLIC PANEL
ALK PHOS: 64 U/L (ref 39–117)
ALT: 38 U/L (ref 0–53)
AST: 28 U/L (ref 0–37)
Albumin: 2.4 g/dL — ABNORMAL LOW (ref 3.5–5.2)
BUN: 21 mg/dL (ref 6–23)
CALCIUM: 8.5 mg/dL (ref 8.4–10.5)
CO2: 26 mEq/L (ref 19–32)
CREATININE: 0.93 mg/dL (ref 0.50–1.35)
Chloride: 104 mEq/L (ref 96–112)
GFR calc non Af Amer: 72 mL/min — ABNORMAL LOW (ref >90)
GFR, EST AFRICAN AMERICAN: 84 mL/min — AB (ref >90)
GLUCOSE: 95 mg/dL (ref 70–99)
Potassium: 4.8 mEq/L (ref 3.7–5.3)
Sodium: 139 mEq/L (ref 137–147)
TOTAL PROTEIN: 5.7 g/dL — AB (ref 6.0–8.3)
Total Bilirubin: 0.4 mg/dL (ref 0.3–1.2)

## 2013-11-02 LAB — MRSA PCR SCREENING: MRSA by PCR: NEGATIVE

## 2013-11-02 LAB — URINALYSIS, ROUTINE W REFLEX MICROSCOPIC
Bilirubin Urine: NEGATIVE
Glucose, UA: NEGATIVE mg/dL
Ketones, ur: NEGATIVE mg/dL
NITRITE: POSITIVE — AB
Protein, ur: 30 mg/dL — AB
SPECIFIC GRAVITY, URINE: 1.02 (ref 1.005–1.030)
UROBILINOGEN UA: 1 mg/dL (ref 0.0–1.0)
pH: 7.5 (ref 5.0–8.0)

## 2013-11-02 LAB — CBC
HEMATOCRIT: 39.7 % (ref 39.0–52.0)
HEMOGLOBIN: 13.3 g/dL (ref 13.0–17.0)
MCH: 31.6 pg (ref 26.0–34.0)
MCHC: 33.5 g/dL (ref 30.0–36.0)
MCV: 94.3 fL (ref 78.0–100.0)
Platelets: 203 10*3/uL (ref 150–400)
RBC: 4.21 MIL/uL — ABNORMAL LOW (ref 4.22–5.81)
RDW: 14.7 % (ref 11.5–15.5)
WBC: 9.3 10*3/uL (ref 4.0–10.5)

## 2013-11-02 LAB — I-STAT CG4 LACTIC ACID, ED: LACTIC ACID, VENOUS: 0.91 mmol/L (ref 0.5–2.2)

## 2013-11-02 LAB — TSH: TSH: 2.54 u[IU]/mL (ref 0.350–4.500)

## 2013-11-02 LAB — I-STAT TROPONIN, ED: Troponin i, poc: 0 ng/mL (ref 0.00–0.08)

## 2013-11-02 LAB — TROPONIN I: Troponin I: <0.3 ng/mL (ref <0.30)

## 2013-11-02 LAB — URINE MICROSCOPIC-ADD ON

## 2013-11-02 LAB — CREATININE, SERUM
Creatinine, Ser: 1.04 mg/dL (ref 0.50–1.35)
GFR calc Af Amer: 71 mL/min — ABNORMAL LOW (ref >90)
GFR, EST NON AFRICAN AMERICAN: 62 mL/min — AB (ref >90)

## 2013-11-02 LAB — PHOSPHORUS: PHOSPHORUS: 2.8 mg/dL (ref 2.3–4.6)

## 2013-11-02 LAB — MAGNESIUM: MAGNESIUM: 2.1 mg/dL (ref 1.5–2.5)

## 2013-11-02 MED ORDER — MORPHINE SULFATE 2 MG/ML IJ SOLN
2.0000 mg | INTRAMUSCULAR | Status: DC | PRN
  Filled 2013-11-02: qty 1

## 2013-11-02 MED ORDER — DEXTROSE 5 % IV SOLN
1.0000 g | Freq: Once | INTRAVENOUS | Status: AC
  Administered 2013-11-02: 1 g via INTRAVENOUS
  Filled 2013-11-02: qty 10

## 2013-11-02 MED ORDER — MEMANTINE HCL 10 MG PO TABS
10.0000 mg | ORAL_TABLET | Freq: Two times a day (BID) | ORAL | Status: DC
  Administered 2013-11-02 – 2013-11-03 (×3): 10 mg via ORAL
  Filled 2013-11-02 (×4): qty 1

## 2013-11-02 MED ORDER — CLOPIDOGREL BISULFATE 75 MG PO TABS
75.0000 mg | ORAL_TABLET | Freq: Every day | ORAL | Status: DC
  Administered 2013-11-03: 75 mg via ORAL
  Filled 2013-11-02 (×2): qty 1

## 2013-11-02 MED ORDER — ASPIRIN 81 MG PO CHEW: 324.0000 mg | CHEWABLE_TABLET | Freq: Once | ORAL | Status: DC

## 2013-11-02 MED ORDER — CALCIUM CARBONATE ANTACID 500 MG PO CHEW
1.0000 | CHEWABLE_TABLET | Freq: Two times a day (BID) | ORAL | Status: DC | PRN
  Filled 2013-11-02: qty 1

## 2013-11-02 MED ORDER — DIVALPROEX SODIUM 250 MG PO DR TAB
250.0000 mg | DELAYED_RELEASE_TABLET | Freq: Two times a day (BID) | ORAL | Status: DC
  Administered 2013-11-02 – 2013-11-03 (×3): 250 mg via ORAL
  Filled 2013-11-02 (×4): qty 1

## 2013-11-02 MED ORDER — ADULT MULTIVITAMIN W/MINERALS CH
1.0000 | ORAL_TABLET | Freq: Every day | ORAL | Status: DC
  Administered 2013-11-03: 1 via ORAL
  Filled 2013-11-02: qty 1

## 2013-11-02 MED ORDER — VITAMIN C 500 MG PO TABS
500.0000 mg | ORAL_TABLET | Freq: Every day | ORAL | Status: DC
  Administered 2013-11-02 – 2013-11-03 (×2): 500 mg via ORAL
  Filled 2013-11-02 (×2): qty 1

## 2013-11-02 MED ORDER — AMLODIPINE BESYLATE 5 MG PO TABS
5.0000 mg | ORAL_TABLET | Freq: Every day | ORAL | Status: DC
  Administered 2013-11-03: 5 mg via ORAL
  Filled 2013-11-02 (×2): qty 1

## 2013-11-02 MED ORDER — SIMVASTATIN 20 MG PO TABS
20.0000 mg | ORAL_TABLET | Freq: Every evening | ORAL | Status: DC
  Administered 2013-11-02: 20 mg via ORAL
  Filled 2013-11-02 (×2): qty 1

## 2013-11-02 MED ORDER — ENOXAPARIN SODIUM 40 MG/0.4ML ~~LOC~~ SOLN
40.0000 mg | SUBCUTANEOUS | Status: DC
  Administered 2013-11-02 – 2013-11-03 (×2): 40 mg via SUBCUTANEOUS
  Filled 2013-11-02 (×2): qty 0.4

## 2013-11-02 MED ORDER — L-METHYLFOLATE-B12-B6-B2 6-1-50-5 MG PO TABS: 1.0000 | ORAL_TABLET | ORAL | Status: DC

## 2013-11-02 MED ORDER — ONE-DAILY MULTI VITAMINS PO TABS: 1.0000 | ORAL_TABLET | ORAL | Status: DC

## 2013-11-02 MED ORDER — TAMSULOSIN HCL 0.4 MG PO CAPS
0.4000 mg | ORAL_CAPSULE | Freq: Every day | ORAL | Status: DC
  Administered 2013-11-03: 0.4 mg via ORAL
  Filled 2013-11-02 (×2): qty 1

## 2013-11-02 MED ORDER — GALANTAMINE HYDROBROMIDE 4 MG PO TABS
12.0000 mg | ORAL_TABLET | Freq: Two times a day (BID) | ORAL | Status: DC
  Administered 2013-11-02 – 2013-11-03 (×3): 12 mg via ORAL
  Filled 2013-11-02 (×4): qty 3

## 2013-11-02 MED ORDER — VITAMIN D3 25 MCG (1000 UNIT) PO TABS
1000.0000 [IU] | ORAL_TABLET | ORAL | Status: DC
  Administered 2013-11-03: 1000 [IU] via ORAL
  Filled 2013-11-02 (×2): qty 1

## 2013-11-02 MED ORDER — VITAMIN B-12 100 MCG PO TABS
100.0000 ug | ORAL_TABLET | ORAL | Status: DC
  Administered 2013-11-03: 100 ug via ORAL
  Filled 2013-11-02 (×2): qty 1

## 2013-11-02 MED ORDER — CITALOPRAM HYDROBROMIDE 10 MG PO TABS
10.0000 mg | ORAL_TABLET | ORAL | Status: DC
  Administered 2013-11-03: 10 mg via ORAL
  Filled 2013-11-02 (×2): qty 1

## 2013-11-02 MED ORDER — DEXTROSE 5 % IV SOLN
1.0000 g | INTRAVENOUS | Status: DC
  Administered 2013-11-03: 1 g via INTRAVENOUS
  Filled 2013-11-02: qty 10

## 2013-11-02 MED ORDER — OXYBUTYNIN CHLORIDE 5 MG PO TABS
5.0000 mg | ORAL_TABLET | Freq: Two times a day (BID) | ORAL | Status: DC | PRN
  Filled 2013-11-02: qty 1

## 2013-11-02 NOTE — Progress Notes (Signed)
Called to get report, given by Margreta Journey, RN.  Will await for pt. Arrival to floor and continue to monitor.  Alphonzo Lemmings, RN

## 2013-11-02 NOTE — ED Notes (Signed)
Food tray ordered

## 2013-11-02 NOTE — ED Notes (Signed)
PA at bedside at this time.  

## 2013-11-02 NOTE — Plan of Care (Signed)
Problem: Phase I Progression Outcomes Goal: Initial discharge plan identified Outcome: Completed/Met Date Met:  11/02/13 To return to [facility] for rehab.

## 2013-11-02 NOTE — Progress Notes (Signed)
ANTIBIOTIC CONSULT NOTE - INITIAL  Pharmacy Consult for Ceftriaxone Indication: UTI  No Known Allergies  Patient Measurements: Height: 5\' 6"  (167.6 cm) Weight: 173 lb 13.1 oz (78.844 kg) IBW/kg (Calculated) : 63.8  Vital Signs: Temp: 97.8 F (36.6 C) (05/22 1320) Temp src: Oral (05/22 1320) BP: 127/75 mmHg (05/22 1320) Pulse Rate: 52 (05/22 1320)  Labs:  Recent Labs  11/02/13 1010  WBC 7.4  HGB 13.1  PLT 188  CREATININE 0.93   Estimated Creatinine Clearance: 53.2 ml/min (by C-G formula based on Cr of 0.93).  Medical History: Past Medical History  Diagnosis Date  . Hypertension   . BPH (benign prostatic hyperplasia)     HAS INDWELLING FOLEY CATHETER  . Alzheimer disease   . Hyperlipidemia   . Erectile dysfunction   . Mild depression   . Spinal stenosis   . CVA (cerebral vascular accident)    Assessment:   Ceftriaxone begun in ED this morning with 1 gram IV dose ~ 11:30am.  Urine and blood cultures sent prior to dose.     Goal of Therapy:  appropriate Ceftriaxone dose for renal function and infection  Plan:   Continue Ceftriaxone 1 gram IV q24hrs.  Should not need any dosage adjustments.  Will follow for culture results.  Arty Baumgartner, [place] Pager: [phone] 11/02/2013,2:40 PM

## 2013-11-02 NOTE — H&P (Signed)
[facility] History and Physical  LAVONTE PALOS GEX:[id number] DOB: 10/20/1921 DOA: 11/02/2013  Referring physician:  PCP: Irven Shelling, MD  Specialists:   Chief Complaint: Chest pain  HPI: Trevor Lutz is a 80 y.o. male  With a history of Alzheimer's dementia, hypertension, recent CVA and May 2015 that presented to the emergency department with chest pain. Patient resides at [facility] and has a caregiver. Information details MS history and physical obtained from the caregiver and friend at bedside. Patient began to have chest pain early this morning, he grabbed his chest and had associated diaphoresis. Chest pain lasted approximately 10 minutes. Patient was given 2 nitroglycerin sublingually, with relief of his symptoms. Patient's caregiver also notes he has been coughing for approximately one week, chest x-ray conducted at [facility] did not show pneumonia. Patient has also been complaining of urinary symptoms and retention, and has Foley catheter in place, which was supposed to be removed today. At this time patient has no chest pain, shortness of breath, and does not remember the events this morning.  Review of Systems:  Unobtainable from patient as he currently has no complaints and does not remember the events this morning.  Past Medical History  Diagnosis Date  . Hypertension   . BPH (benign prostatic hyperplasia)     HAS INDWELLING FOLEY CATHETER  . Alzheimer disease   . Hyperlipidemia   . Erectile dysfunction   . Mild depression   . Spinal stenosis   . CVA (cerebral vascular accident)    Past Surgical History  Procedure Laterality Date  . Kidney surgery      right kidney removed  . Tonsilectomy, adenoidectomy, bilateral myringotomy and tubes    . Hernia repair Left 09/24/1996    Indirect, Dr Lucia Gaskins surgeon  . Transurethral resection of prostate N/A 11/13/2012    Procedure: BI POLAR TRANSURETHRAL RESECTION OF THE PROSTATE (TURP);  Surgeon: Fredricka Bonine, MD;  Location: [facility]S;  Service: Urology;  Laterality: N/A;  . Tee without cardioversion N/A 10/23/2013    Procedure: TRANSESOPHAGEAL ECHOCARDIOGRAM (TEE);  Surgeon: Thayer Headings, MD;  Location: [facility] ENDOSCOPY;  Service: Cardiovascular;  Laterality: N/A;  . Loop recorder implant  10-24-2013    MDT LinQ implanted by Dr Caryl Comes for cryptogenic stroke   Social History:  reports that he has quit smoking. His smoking use included Cigarettes. He smoked 0.00 packs per day. He does not have any smokeless tobacco history on file. He reports that he drinks alcohol. He reports that he does not use illicit drugs. Resides at [facility].  No Known Allergies  Family History  Problem Relation Age of Onset  . Heart disease Father   . Heart disease Mother     Prior to Admission medications   Medication Sig Start Date End Date Taking? Authorizing Provider  amLODipine (NORVASC) 5 MG tablet Take 5 mg by mouth daily before breakfast.    Yes Historical Provider, MD  cholecalciferol (VITAMIN D) 1000 UNITS tablet Take 1,000 Units by mouth every morning.    Yes Historical Provider, MD  citalopram (CELEXA) 10 MG tablet Take 10 mg by mouth every morning.    Yes Historical Provider, MD  clopidogrel (PLAVIX) 75 MG tablet Take 75 mg by mouth daily with breakfast.   Yes Historical Provider, MD  diphenhydramine-acetaminophen (TYLENOL PM) 25-500 MG TABS Take 1 tablet by mouth at bedtime as needed (for sleep).    Yes Historical Provider, MD  divalproex (DEPAKOTE) 250 MG DR tablet Take  250 mg by mouth 2 (two) times daily.    Yes Historical Provider, MD  galantamine (RAZADYNE) 12 MG tablet Take 12 mg by mouth 2 (two) times daily.   Yes Historical Provider, MD  ibuprofen (ADVIL,MOTRIN) 200 MG tablet Take 400 mg by mouth every 8 (eight) hours as needed for mild pain.   Yes Historical Provider, MD  l-methylfolate-b2-b6-b12 (CEREFOLIN) 11-12-48-5 MG TABS Take 1 tablet by mouth every morning.    Yes Historical  Provider, MD  memantine (NAMENDA) 10 MG tablet Take 10 mg by mouth 2 (two) times daily.   Yes Historical Provider, MD  Multiple Vitamin (MULTIVITAMIN) tablet Take 1 tablet by mouth every morning.    Yes Historical Provider, MD  oxybutynin (DITROPAN) 5 MG tablet Take 5 mg by mouth 2 (two) times daily as needed for bladder spasms.   Yes Historical Provider, MD  simvastatin (ZOCOR) 20 MG tablet Take 20 mg by mouth every evening.   Yes Historical Provider, MD  tamsulosin (FLOMAX) 0.4 MG CAPS Take 0.4 mg by mouth daily after breakfast.    Yes Historical Provider, MD  vitamin B-12 (CYANOCOBALAMIN) 100 MCG tablet Take 100 mcg by mouth every morning.    Yes Historical Provider, MD  vitamin C (ASCORBIC ACID) 500 MG tablet Take 500 mg by mouth daily.   Yes Historical Provider, MD   Physical Exam: Filed Vitals:   11/02/13 0945  BP: 117/71  Pulse: 58  Temp:   Resp: 20     General: Well developed, well nourished, NAD, appears stated age  HEENT: NCAT, PERRLA, EOMI, Anicteic Sclera, mucous membranes moist.   Neck: Supple, no JVD, no masses  Cardiovascular: S1 S2 auscultated, no rubs, murmurs or gallops. Regular rate and rhythm.  Respiratory: Clear to auscultation bilaterally with equal chest rise  Abdomen: Soft, nontender, nondistended, + bowel sounds, right inguinal hernia  Extremities: warm dry without cyanosis clubbing or edema  Neuro: AAOx3, cranial nerves grossly intact. Right-sided weakness in the right upper extremity, otherwise no focal deficits.  Skin: Without rashes exudates or nodules  Psych: Pleasantly confused, normal affect and demeanor  Labs on Admission:  Basic Metabolic Panel:  Recent Labs Lab 11/02/13 1010  NA 139  K 4.8  CL 104  CO2 26  GLUCOSE 95  BUN 21  CREATININE 0.93  CALCIUM 8.5   Liver Function Tests:  Recent Labs Lab 11/02/13 1010  AST 28  ALT 38  ALKPHOS 64  BILITOT 0.4  PROT 5.7*  ALBUMIN 2.4*   No results found for this basename:  LIPASE, AMYLASE,  in the last 168 hours No results found for this basename: AMMONIA,  in the last 168 hours CBC:  Recent Labs Lab 11/02/13 1010  WBC 7.4  NEUTROABS 5.2  HGB 13.1  HCT 38.5*  MCV 94.4  PLT 188   Cardiac Enzymes: No results found for this basename: CKTOTAL, CKMB, CKMBINDEX, TROPONINI,  in the last 168 hours  BNP (last 3 results) No results found for this basename: PROBNP,  in the last 8760 hours CBG: No results found for this basename: GLUCAP,  in the last 168 hours  Radiological Exams on Admission: Dg Chest Port 1 View  11/02/2013   CLINICAL DATA:  Chest pain and cough  EXAM: PORTABLE CHEST - 1 VIEW  COMPARISON:  DG CHEST 1V PORT dated 10/25/2013  FINDINGS: The heart size and mediastinal contours are within normal limits. Both lungs are clear. The visualized skeletal structures are unremarkable. Right hemidiaphragmatic eventration reidentified. Bilateral glenohumeral joint  degenerative change identified. Electrode projects over the left heart border.  IMPRESSION: No acute cardiopulmonary process.   Electronically Signed   By: Conchita Paris M.D.   On: 11/02/2013 10:05    EKG: Independently reviewed. Sinus, rate 59  Assessment/Plan  Chest pain  -Patient will be admitted for observation to telemetry unit. -Patient recently had a stroke on 10/21/2013 had loop recorder implanted. -Patient currently is chest pain-free. -First troponin is negative. -Will continue to cycle his troponins every 6 hours, obtain TSH, magnesium, phosphorus -Lipid panel on 10/21/2013: 138/80/44/78 -Will continue Plavix and simvastatin.  Patient currently is on amlodipine. Not on beta blocker and to bradycardia. -Cardiology consulted -Patient had TEE on on 10/23/2013, EF of 50-55%  Urinary tract infection -Patient had urinary retention and required Foley catheter placement. -UA: Large leukocytes, positive nitrites, WBC TNTC -Pending urine culture -Will place patient on  ceftriaxone.  Urinary retention/BPH -Patient was to see Dr. Junious Silk, urologist today -Foley catheter was to be removed today -Will call urology -Continue Flomax, oxybutynin  Hypertension -Controlled, continue amlodipine  Depression -Continue Celexa  Dementia -Continue galantamine, Namenda  DVT prophylaxis: Lovenox   Code Status: DNR  Condition: Stable  Family Communication: Friend and caregiver at bedside. Admission, patients condition and plan of care including tests being ordered have been discussed with the patient and caregivers who indicate understanding and agree with the plan and Code Status.  Disposition Plan: Admitted for observation  Time spent: 60 minutes   Calyn Sivils D.O. [facility] Pager [phone]  If 7PM-7AM, please contact night-coverage [email or web address] Password [facility] 11/02/2013, 11:46 AM

## 2013-11-02 NOTE — ED Notes (Signed)
Bladder scan revealed volume of 42ml at this time, pt has foley that was present on arrival.

## 2013-11-02 NOTE — ED Provider Notes (Signed)
Medical screening examination/treatment/procedure(s) were conducted as a shared visit with non-physician practitioner(s) and myself.  I personally evaluated the patient during the encounter.   EKG Interpretation   Date/Time:  Friday Nov 02 2013 09:14:55 EDT Ventricular Rate:  59 PR Interval:  160 QRS Duration: 95 QT Interval:  420 QTC Calculation: 416 R Axis:   56 Text Interpretation:  Sinus rhythm No significant change since last  tracing Confirmed by Maryan Rued  MD, Loree Fee ([id number]) on 11/02/2013 9:22:56 AM      Pt with story concerning for ACS with CP today that is now resolved.  No prior cardiac hx other than HTN adn HLD.  Initially normal labs other than UTI and pt to have foley removed today.  Started on abx and will admit for CP obs.  Blanchie Dessert, MD 11/02/13 1510

## 2013-11-02 NOTE — Progress Notes (Signed)
No events recorded on the [employer] Loop recorder.  Trevor Lutz, [facility]

## 2013-11-02 NOTE — Consult Note (Signed)
Reason for Consult:  Chest Pain Referring Physician:   [name] is an 80 y.o. male.  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 He has a history of hypertension, Alzheimer's disease, hyperlipidemia, CVA, spinal stenosis.  Patient was discharged on 10/24/2013 after suffering a CVA with dense right-sided hemiparesis and expressive aphasia.  He was put on Plavix by neurology.  TEE completed during that admission revealed normal LV function mild MR no left atrial appendage thrombus.  Dr. Cleda Mccreedy was consulted for implantation of a loop recorder which was completed on May 13.  Patient presented today after having chest pain. When I asked him, he did not recall having any CP and his caregiver, who is with him now, was not there this morning to corroborate.  He is currently pain-free.  He is sitting up in bed having lunch. The patient currently denies nausea, vomiting, fever, shortness of breath, orthopnea, dizziness, PND, cough, congestion, abdominal pain, hematochezia, lower extremity edema.  Caregiver did report that his blood pressure was sky high.   EKG shows sinus bradycardia at a rate of 59 beats per minute.    Past Medical History  Diagnosis Date  . Hypertension   . BPH (benign prostatic hyperplasia)     HAS INDWELLING FOLEY CATHETER  . Alzheimer disease   . Hyperlipidemia   . Erectile dysfunction   . Mild depression   . Spinal stenosis   . CVA (cerebral vascular accident)     Past Surgical History  Procedure Laterality Date  . Kidney surgery      right kidney removed  . Tonsilectomy, adenoidectomy, bilateral myringotomy and tubes    . Hernia repair Left 09/24/1996    Indirect, Dr Lucia Gaskins surgeon  . Transurethral resection of prostate N/A 11/13/2012    Procedure: BI POLAR TRANSURETHRAL RESECTION OF THE PROSTATE (TURP);  Surgeon: Fredricka Bonine, MD;  Location: [facility]S;  Service: Urology;  Laterality: N/A;  . Tee without  cardioversion N/A 10/23/2013    Procedure: TRANSESOPHAGEAL ECHOCARDIOGRAM (TEE);  Surgeon: Thayer Headings, MD;  Location: [facility] ENDOSCOPY;  Service: Cardiovascular;  Laterality: N/A;  . Loop recorder implant  10-24-2013    MDT LinQ implanted by Dr Caryl Comes for cryptogenic stroke    Family History  Problem Relation Age of Onset  . Heart disease Father   . Heart disease Mother     Social History:  reports that he has quit smoking. His smoking use included Cigarettes. He smoked 0.00 packs per day. He does not have any smokeless tobacco history on file. He reports that he drinks alcohol. He reports that he does not use illicit drugs.  Allergies: No Known Allergies  Medications:  Prior to Admission medications   Medication Sig Start Date End Date Taking? Authorizing Provider  amLODipine (NORVASC) 5 MG tablet Take 5 mg by mouth daily before breakfast.    Yes Historical Provider, MD  cholecalciferol (VITAMIN D) 1000 UNITS tablet Take 1,000 Units by mouth every morning.    Yes Historical Provider, MD  citalopram (CELEXA) 10 MG tablet Take 10 mg by mouth every morning.    Yes Historical Provider, MD  clopidogrel (PLAVIX) 75 MG tablet Take 75 mg by mouth daily with breakfast.   Yes Historical Provider, MD  diphenhydramine-acetaminophen (TYLENOL PM) 25-500 MG TABS Take 1 tablet by mouth at bedtime as needed (for sleep).    Yes Historical Provider, MD  divalproex (DEPAKOTE) 250 MG DR tablet Take  250 mg by mouth 2 (two) times daily.    Yes Historical Provider, MD  galantamine (RAZADYNE) 12 MG tablet Take 12 mg by mouth 2 (two) times daily.   Yes Historical Provider, MD  ibuprofen (ADVIL,MOTRIN) 200 MG tablet Take 400 mg by mouth every 8 (eight) hours as needed for mild pain.   Yes Historical Provider, MD  l-methylfolate-b2-b6-b12 (CEREFOLIN) 11-12-48-5 MG TABS Take 1 tablet by mouth every morning.    Yes Historical Provider, MD  memantine (NAMENDA) 10 MG tablet Take 10 mg by mouth 2 (two) times daily.   Yes  Historical Provider, MD  Multiple Vitamin (MULTIVITAMIN) tablet Take 1 tablet by mouth every morning.    Yes Historical Provider, MD  oxybutynin (DITROPAN) 5 MG tablet Take 5 mg by mouth 2 (two) times daily as needed for bladder spasms.   Yes Historical Provider, MD  simvastatin (ZOCOR) 20 MG tablet Take 20 mg by mouth every evening.   Yes Historical Provider, MD  tamsulosin (FLOMAX) 0.4 MG CAPS Take 0.4 mg by mouth daily after breakfast.    Yes Historical Provider, MD  vitamin B-12 (CYANOCOBALAMIN) 100 MCG tablet Take 100 mcg by mouth every morning.    Yes Historical Provider, MD  vitamin C (ASCORBIC ACID) 500 MG tablet Take 500 mg by mouth daily.   Yes Historical Provider, MD     Results for orders placed during the hospital encounter of 11/02/13 (from the past 48 hour(s))  URINALYSIS, ROUTINE W REFLEX MICROSCOPIC     Status: Abnormal   Collection Time    11/02/13 10:00 AM      Result Value Ref Range   Color, Urine YELLOW  YELLOW   APPearance CLOUDY (*) CLEAR   Specific Gravity, Urine 1.020  1.005 - 1.030   pH 7.5  5.0 - 8.0   Glucose, UA NEGATIVE  NEGATIVE mg/dL   Hgb urine dipstick MODERATE (*) NEGATIVE   Bilirubin Urine NEGATIVE  NEGATIVE   Ketones, ur NEGATIVE  NEGATIVE mg/dL   Protein, ur 30 (*) NEGATIVE mg/dL   Urobilinogen, UA 1.0  0.0 - 1.0 mg/dL   Nitrite POSITIVE (*) NEGATIVE   Leukocytes, UA LARGE (*) NEGATIVE  URINE MICROSCOPIC-ADD ON     Status: Abnormal   Collection Time    11/02/13 10:00 AM      Result Value Ref Range   Squamous Epithelial / LPF RARE  RARE   WBC, UA TOO NUMEROUS TO COUNT  <3 WBC/hpf   RBC / HPF 7-10  <3 RBC/hpf   Bacteria, UA MANY (*) RARE  CBC WITH DIFFERENTIAL     Status: Abnormal   Collection Time    11/02/13 10:10 AM      Result Value Ref Range   WBC 7.4  4.0 - 10.5 K/uL   RBC 4.08 (*) 4.22 - 5.81 MIL/uL   Hemoglobin 13.1  13.0 - 17.0 g/dL   HCT 38.5 (*) 39.0 - 52.0 %   MCV 94.4  78.0 - 100.0 fL   MCH 32.1  26.0 - 34.0 pg   MCHC 34.0   30.0 - 36.0 g/dL   RDW 14.5  11.5 - 15.5 %   Platelets 188  150 - 400 K/uL   Neutrophils Relative % 69  43 - 77 %   Neutro Abs 5.2  1.7 - 7.7 K/uL   Lymphocytes Relative 16  12 - 46 %   Lymphs Abs 1.2  0.7 - 4.0 K/uL   Monocytes Relative 9  3 - 12 %  Monocytes Absolute 0.6  0.1 - 1.0 K/uL   Eosinophils Relative 6 (*) 0 - 5 %   Eosinophils Absolute 0.4  0.0 - 0.7 K/uL   Basophils Relative 0  0 - 1 %   Basophils Absolute 0.0  0.0 - 0.1 K/uL  COMPREHENSIVE METABOLIC PANEL     Status: Abnormal   Collection Time    11/02/13 10:10 AM      Result Value Ref Range   Sodium 139  137 - 147 mEq/L   Potassium 4.8  3.7 - 5.3 mEq/L   Chloride 104  96 - 112 mEq/L   CO2 26  19 - 32 mEq/L   Glucose, Bld 95  70 - 99 mg/dL   BUN 21  6 - 23 mg/dL   Creatinine, Ser 0.93  0.50 - 1.35 mg/dL   Calcium 8.5  8.4 - 10.5 mg/dL   Total Protein 5.7 (*) 6.0 - 8.3 g/dL   Albumin 2.4 (*) 3.5 - 5.2 g/dL   AST 28  0 - 37 U/L   ALT 38  0 - 53 U/L   Alkaline Phosphatase 64  39 - 117 U/L   Total Bilirubin 0.4  0.3 - 1.2 mg/dL   GFR calc non Af Amer 72 (*) >90 mL/min   GFR calc Af Amer 84 (*) >90 mL/min   Comment: (NOTE)     The eGFR has been calculated using the CKD EPI equation.     This calculation has not been validated in all clinical situations.     eGFR's persistently <90 mL/min signify possible Chronic Kidney     Disease.  Randolm Idol, ED     Status: None   Collection Time    11/02/13 10:25 AM      Result Value Ref Range   Troponin i, poc 0.00  0.00 - 0.08 ng/mL   Comment 3            Comment: Due to the release kinetics of cTnI,     a negative result within the first hours     of the onset of symptoms does not rule out     myocardial infarction with certainty.     If myocardial infarction is still suspected,     repeat the test at appropriate intervals.  I-STAT CG4 LACTIC ACID, ED     Status: None   Collection Time    11/02/13 10:27 AM      Result Value Ref Range   Lactic Acid, Venous  0.91  0.5 - 2.2 mmol/L    Dg Chest Port 1 View  11/02/2013   CLINICAL DATA:  Chest pain and cough  EXAM: PORTABLE CHEST - 1 VIEW  COMPARISON:  DG CHEST 1V PORT dated 10/25/2013  FINDINGS: The heart size and mediastinal contours are within normal limits. Both lungs are clear. The visualized skeletal structures are unremarkable. Right hemidiaphragmatic eventration reidentified. Bilateral glenohumeral joint degenerative change identified. Electrode projects over the left heart border.  IMPRESSION: No acute cardiopulmonary process.   Electronically Signed   By: Conchita Paris M.D.   On: 11/02/2013 10:05    Review of Systems  Constitutional: Negative for fever and diaphoresis.  HENT: Negative for congestion.   Respiratory: Negative for cough and shortness of breath.   Cardiovascular: Negative for chest pain, orthopnea, leg swelling and PND.  Gastrointestinal: Negative for nausea, vomiting, abdominal pain and blood in stool.  Genitourinary: Negative for dysuria and hematuria.  All other systems reviewed and are negative.  Blood pressure 127/75, pulse 52, temperature 97.8 F (36.6 C), temperature source Oral, resp. rate 18, height '5\' 6"'  (1.676 m), weight 173 lb 13.1 oz (78.844 kg), SpO2 97.00%. Physical Exam  Nursing note and vitals reviewed. Constitutional: He is oriented to person, place, and time. He appears well-developed and well-nourished. No distress.  HENT:  Head: Normocephalic and atraumatic.  Eyes: EOM are normal. Pupils are equal, round, and reactive to light. No scleral icterus.  Neck: Normal range of motion. Neck supple.  Cardiovascular: Normal rate, regular rhythm, S1 normal and S2 normal.   No murmur heard. Pulses:      Radial pulses are 2+ on the right side, and 2+ on the left side.       Dorsalis pedis pulses are 1+ on the right side, and 1+ on the left side.  No carotid bruit  Respiratory: Effort normal and breath sounds normal. He has no wheezes. He has no rales.  GI:  Soft. Bowel sounds are normal. He exhibits no distension. There is no tenderness.  Musculoskeletal: He exhibits no edema.  Neurological: He is alert and oriented to person, place, and time. He exhibits normal muscle tone.  Skin: Skin is warm and dry.  Psychiatric: He has a normal mood and affect.    Assessment/Plan: Active Problems:   Chest pain Patient is currently pain-free. EKG shows sinus bradycardia at a rate of 59 beats per minute with no acute EKG changes. Initial troponin is negative. We'll cycle q. 6 hour.  He is on Plavix for recent CVA.  We'll interrogate loop recorder.        CVA (cerebral infarction)  Recent we diagnosed and discharge on May 13   Essential hypertension  Depression well controlled.  He is on amlodipine 5 mg daily,   Dementia   Dyslipidemia   he is on a statin   UTI (lower urinary tract infection)  Treatment per primary team    Tarri Fuller, PA-C. 11/02/2013, 2:11 PM   Patient seen and examined with Tarri Fuller, PA-C. We discussed all aspects of the encounter. I agree with the assessment and plan as stated above.   Patient unable to provide history. Apparently had episode of CP earlier today. Now resolved. ECG and CEs negative. Recent echo normal. Has significant dementia and recent CVA. Would favor medical management. If CP recurs can get [employer]. We will interrogate ILR but doubt this will show anything. We will sign off. Please call with questions. Can add PPI as needed for ? GERD.   Shaune Pascal Bensimhon,MD 2:35 PM

## 2013-11-02 NOTE — Clinical Social Work Note (Signed)
CSW received call from [facility]. Facility states that patient is from their facility and that family members have told them that patient will be brought back to [facility] on Saturday. Facility states that they are prepared to accept patient back over the weekend as long as the patient remains observation. If patient is changed to inpatient class, patient's insurance company will require new authorization. Case Manager with [facility] is Rosalita Chessman [phone] ext. [phone]. CSW will leave report for weekend CSW.  Liz Beach MSW, [place], [place], [phone]

## 2013-11-02 NOTE — ED Notes (Signed)
Per EMS, pt from rehab facility. Called 911 around 0800 stating pt had chest pain and elevated bp with sweating. Nitro x 2 before EMS arrival, EMS gave 324 mg aspirin en route to hospital, pt told EMS he was not having chest pain and had not had any today. Caregiver accompanying pt states pt has dementia and often "forgets things", states she observed pt complain of chest pain around 0800, pt clutched his chest and was sweating. Also states pt has been having chest congestion and coughing up yellow sputum. EMS and caregiver states pt had stroke 8 days ago that affected R side.

## 2013-11-02 NOTE — ED Provider Notes (Signed)
CSN: [id number]     Arrival date & time 11/02/13  0905 History   First MD Initiated Contact with Patient 11/02/13 0919     Chief Complaint  Patient presents with  . Chest Pain     (Consider location/radiation/quality/duration/timing/severity/associated sxs/prior Treatment) HPI Comments: Patient is an 80 year old male with history of recent stroke, hypertension, benign prostatic hyperplasia, Alzheimer's disease, hyperlipidemia who presents today from his rehabilitation facility after an episode of chest pain. The caregivers in the room who reports that the chest pain lasted approximately 10 minutes it was associated with diaphoresis. He received 2 nitroglycerin with relief of his symptoms. His caregiver also reports that he has also been coughing for the past few days. The cough is associated with productive sputum. No fever or chills. He has dysuria. He has a Foley catheter currently which was scheduled to come out today. His rehabilitation for his stroke is going well and he has began walking. He does have residual right-sided deficit. The patient denies pain anywhere or shortness of breath. The patient cannot remember what happened today.  Patient is a 80 y.o. male presenting with chest pain. The history is provided by the patient. No language interpreter was used.  Chest Pain Associated symptoms: cough and diaphoresis   Associated symptoms: no fever     Past Medical History  Diagnosis Date  . Hypertension   . BPH (benign prostatic hyperplasia)     HAS INDWELLING FOLEY CATHETER  . Alzheimer disease   . Hyperlipidemia   . Erectile dysfunction   . Mild depression   . Spinal stenosis   . CVA (cerebral vascular accident)    Past Surgical History  Procedure Laterality Date  . Kidney surgery      right kidney removed  . Tonsilectomy, adenoidectomy, bilateral myringotomy and tubes    . Hernia repair Left 09/24/1996    Indirect, Dr Lucia Gaskins surgeon  . Transurethral resection of prostate  N/A 11/13/2012    Procedure: BI POLAR TRANSURETHRAL RESECTION OF THE PROSTATE (TURP);  Surgeon: Fredricka Bonine, MD;  Location: [facility];  Service: Urology;  Laterality: N/A;  . Tee without cardioversion N/A 10/23/2013    Procedure: TRANSESOPHAGEAL ECHOCARDIOGRAM (TEE);  Surgeon: Thayer Headings, MD;  Location: [facility] ENDOSCOPY;  Service: Cardiovascular;  Laterality: N/A;  . Loop recorder implant  10-24-2013    MDT LinQ implanted by Dr Caryl Comes for cryptogenic stroke   Family History  Problem Relation Age of Onset  . Heart disease Father   . Heart disease Mother    History  Substance Use Topics  . Smoking status: Former Smoker    Types: Cigarettes  . Smokeless tobacco: Not on file     Comment: QUIT SMOKING MANY YEARS AGO  . Alcohol Use: Yes    Review of Systems  Constitutional: Positive for diaphoresis. Negative for fever and chills.  Respiratory: Positive for cough.   Cardiovascular: Positive for chest pain.  Genitourinary: Positive for dysuria.  All other systems reviewed and are negative.     Allergies  Review of patient's allergies indicates no known allergies.  Home Medications   Prior to Admission medications   Medication Sig Start Date End Date Taking? Authorizing Provider  amLODipine (NORVASC) 5 MG tablet Take 5 mg by mouth daily before breakfast.    Yes Historical Provider, MD  cholecalciferol (VITAMIN D) 1000 UNITS tablet Take 1,000 Units by mouth every morning.    Yes Historical Provider, MD  citalopram (CELEXA) 10 MG tablet Take 10 mg by mouth  every morning.    Yes Historical Provider, MD  clopidogrel (PLAVIX) 75 MG tablet Take 75 mg by mouth daily with breakfast.   Yes Historical Provider, MD  diphenhydramine-acetaminophen (TYLENOL PM) 25-500 MG TABS Take 1 tablet by mouth at bedtime as needed (for sleep).    Yes Historical Provider, MD  divalproex (DEPAKOTE) 250 MG DR tablet Take 250 mg by mouth 2 (two) times daily.    Yes Historical Provider, MD  galantamine  (RAZADYNE) 12 MG tablet Take 12 mg by mouth 2 (two) times daily.   Yes Historical Provider, MD  ibuprofen (ADVIL,MOTRIN) 200 MG tablet Take 400 mg by mouth every 8 (eight) hours as needed for mild pain.   Yes Historical Provider, MD  l-methylfolate-b2-b6-b12 (CEREFOLIN) 11-12-48-5 MG TABS Take 1 tablet by mouth every morning.    Yes Historical Provider, MD  memantine (NAMENDA) 10 MG tablet Take 10 mg by mouth 2 (two) times daily.   Yes Historical Provider, MD  Multiple Vitamin (MULTIVITAMIN) tablet Take 1 tablet by mouth every morning.    Yes Historical Provider, MD  oxybutynin (DITROPAN) 5 MG tablet Take 5 mg by mouth 2 (two) times daily as needed for bladder spasms.   Yes Historical Provider, MD  simvastatin (ZOCOR) 20 MG tablet Take 20 mg by mouth every evening.   Yes Historical Provider, MD  tamsulosin (FLOMAX) 0.4 MG CAPS Take 0.4 mg by mouth daily after breakfast.    Yes Historical Provider, MD  vitamin B-12 (CYANOCOBALAMIN) 100 MCG tablet Take 100 mcg by mouth every morning.    Yes Historical Provider, MD  vitamin C (ASCORBIC ACID) 500 MG tablet Take 500 mg by mouth daily.   Yes Historical Provider, MD   BP 129/73  Pulse 58  Temp(Src) 97.7 F (36.5 C) (Oral)  Resp 18  Ht 5\' 6"  (1.676 m)  Wt 165 lb (74.844 kg)  BMI 26.64 kg/m2  SpO2 95% Physical Exam  Nursing note and vitals reviewed. Constitutional: He appears well-developed and well-nourished. No distress.  HENT:  Head: Normocephalic and atraumatic.  Right Ear: External ear normal.  Left Ear: External ear normal.  Nose: Nose normal.  Eyes: Conjunctivae are normal.  Neck: Normal range of motion. No tracheal deviation present.  Cardiovascular: Normal rate, regular rhythm, normal heart sounds, intact distal pulses and normal pulses.   Pulses:      Radial pulses are 2+ on the right side, and 2+ on the left side.       Posterior tibial pulses are 2+ on the right side, and 2+ on the left side.  Pulmonary/Chest: Effort normal and  breath sounds normal. No stridor.  Abdominal: Soft. He exhibits no distension. There is no tenderness.  Musculoskeletal: Normal range of motion.  Neurological: He is alert.  Right sided weakness.  Skin: Skin is warm and dry. He is not diaphoretic.  Psychiatric: He has a normal mood and affect. His behavior is normal.    ED Course  Procedures (including critical care time) Labs Review Labs Reviewed  CBC WITH DIFFERENTIAL - Abnormal; Notable for the following:    RBC 4.08 (*)    HCT 38.5 (*)    Eosinophils Relative 6 (*)    All other components within normal limits  COMPREHENSIVE METABOLIC PANEL - Abnormal; Notable for the following:    Total Protein 5.7 (*)    Albumin 2.4 (*)    GFR calc non Af Amer 72 (*)    GFR calc Af Amer 84 (*)    All  other components within normal limits  URINALYSIS, ROUTINE W REFLEX MICROSCOPIC - Abnormal; Notable for the following:    APPearance CLOUDY (*)    Hgb urine dipstick MODERATE (*)    Protein, ur 30 (*)    Nitrite POSITIVE (*)    Leukocytes, UA LARGE (*)    All other components within normal limits  URINE MICROSCOPIC-ADD ON - Abnormal; Notable for the following:    Bacteria, UA MANY (*)    All other components within normal limits  CULTURE, BLOOD (ROUTINE X 2)  CULTURE, BLOOD (ROUTINE X 2)  URINE CULTURE  MRSA PCR SCREENING  TROPONIN I  TROPONIN I  TROPONIN I  CBC  CREATININE, SERUM  TSH  MAGNESIUM  PHOSPHORUS  I-STAT CG4 LACTIC ACID, ED  Randolm Idol, ED    Imaging Review Dg Chest Port 1 View  11/02/2013   CLINICAL DATA:  Chest pain and cough  EXAM: PORTABLE CHEST - 1 VIEW  COMPARISON:  DG CHEST 1V PORT dated 10/25/2013  FINDINGS: The heart size and mediastinal contours are within normal limits. Both lungs are clear. The visualized skeletal structures are unremarkable. Right hemidiaphragmatic eventration reidentified. Bilateral glenohumeral joint degenerative change identified. Electrode projects over the left heart border.   IMPRESSION: No acute cardiopulmonary process.   Electronically Signed   By: Conchita Paris M.D.   On: 11/02/2013 10:05     EKG Interpretation   Date/Time:  Friday Nov 02 2013 09:14:55 EDT Ventricular Rate:  59 PR Interval:  160 QRS Duration: 95 QT Interval:  420 QTC Calculation: 416 R Axis:   56 Text Interpretation:  Sinus rhythm No significant change since last  tracing Confirmed by Maryan Rued  MD, Loree Fee ([id number]) on 11/02/2013 9:22:56 AM      MDM   Final diagnoses:  UTI (lower urinary tract infection)  BPH (benign prostatic hyperplasia)  CVA (cerebral infarction)  Dementia  Depression  Dyslipidemia  Essential hypertension  Right inguinal hernia  Chest pain    Patient presents to ED after having a 10 minute episode of chest pain and diaphoresis. Patient cannot given any details about the episode, caregiver at bedside provides history. Concern for cardiac etiology of chest pain. Patient also with UTI. Started on rocephin in ED. Urine and blood cultures sent. Patient is hemodynamically stable. Medicine will admit. Admission is appreciated. Dr. Maryan Rued evaluated patient and agrees with plan. Patient / Family / Caregiver informed of clinical course, understand medical decision-making process, and agree with plan.     Elwyn Lade, PA-C 11/02/13 1410

## 2013-11-03 LAB — CBC
HCT: 39.7 % (ref 39.0–52.0)
HEMOGLOBIN: 13.9 g/dL (ref 13.0–17.0)
MCH: 32.6 pg (ref 26.0–34.0)
MCHC: 35 g/dL (ref 30.0–36.0)
MCV: 93.2 fL (ref 78.0–100.0)
PLATELETS: 211 10*3/uL (ref 150–400)
RBC: 4.26 MIL/uL (ref 4.22–5.81)
RDW: 14.2 % (ref 11.5–15.5)
WBC: 7.5 10*3/uL (ref 4.0–10.5)

## 2013-11-03 LAB — BASIC METABOLIC PANEL
BUN: 24 mg/dL — ABNORMAL HIGH (ref 6–23)
CO2: 26 meq/L (ref 19–32)
Calcium: 8.7 mg/dL (ref 8.4–10.5)
Chloride: 101 mEq/L (ref 96–112)
Creatinine, Ser: 0.94 mg/dL (ref 0.50–1.35)
GFR calc Af Amer: 83 mL/min — ABNORMAL LOW (ref >90)
GFR, EST NON AFRICAN AMERICAN: 72 mL/min — AB (ref >90)
Glucose, Bld: 94 mg/dL (ref 70–99)
Potassium: 4.6 mEq/L (ref 3.7–5.3)
SODIUM: 138 meq/L (ref 137–147)

## 2013-11-03 MED ORDER — CIPROFLOXACIN HCL 500 MG PO TABS: 500.0000 mg | ORAL_TABLET | Freq: Two times a day (BID) | ORAL | Status: AC

## 2013-11-03 NOTE — Progress Notes (Signed)
NURSING PROGRESS NOTE  Trevor Lutz [id number] Discharge Data: 11/03/2013 4:35 PM Attending Provider: No att. providers found [name],WGNF Broadus John, MD     Dorothe Pea to be D/C'd Skilled nursing facility per MD order. After Visit Summary given via EMS to SNF. Per family request, AVS given to caregiver with all questions fully answered. All IV's discontinued with no bleeding noted. All belongings returned to patient for patient to take.  Last Vital Signs:  Blood pressure 106/69, pulse 58, temperature 97.5 F (36.4 C), temperature source Oral, resp. rate 14, height 5\' 6"  (1.676 m), weight 78 kg (171 lb 15.3 oz), SpO2 96.00%.  Discharge Medication List   Medication List    STOP taking these medications       diphenhydramine-acetaminophen 25-500 MG Tabs  Commonly known as:  TYLENOL PM      TAKE these medications       amLODipine 5 MG tablet  Commonly known as:  NORVASC  Take 5 mg by mouth daily before breakfast.     cholecalciferol 1000 UNITS tablet  Commonly known as:  VITAMIN D  Take 1,000 Units by mouth every morning.     ciprofloxacin 500 MG tablet  Commonly known as:  CIPRO  Take 1 tablet (500 mg total) by mouth 2 (two) times daily.     citalopram 10 MG tablet  Commonly known as:  CELEXA  Take 10 mg by mouth every morning.     clopidogrel 75 MG tablet  Commonly known as:  PLAVIX  Take 75 mg by mouth daily with breakfast.     divalproex 250 MG DR tablet  Commonly known as:  DEPAKOTE  Take 250 mg by mouth 2 (two) times daily.     galantamine 12 MG tablet  Commonly known as:  RAZADYNE  Take 12 mg by mouth 2 (two) times daily.     ibuprofen 200 MG tablet  Commonly known as:  ADVIL,MOTRIN  Take 400 mg by mouth every 8 (eight) hours as needed for mild pain.     l-methylfolate-b2-b6-b12 11-12-48-5 MG Tabs  Commonly known as:  CEREFOLIN  Take 1 tablet by mouth every morning.     memantine 10 MG tablet  Commonly known as:  NAMENDA  Take 10 mg by mouth 2  (two) times daily.     multivitamin tablet  Take 1 tablet by mouth every morning.     oxybutynin 5 MG tablet  Commonly known as:  DITROPAN  Take 5 mg by mouth 2 (two) times daily as needed for bladder spasms.     simvastatin 20 MG tablet  Commonly known as:  ZOCOR  Take 20 mg by mouth every evening.     tamsulosin 0.4 MG Caps capsule  Commonly known as:  FLOMAX  Take 0.4 mg by mouth daily after breakfast.     vitamin B-12 100 MCG tablet  Commonly known as:  CYANOCOBALAMIN  Take 100 mcg by mouth every morning.     vitamin C 500 MG tablet  Commonly known as:  ASCORBIC ACID  Take 500 mg by mouth daily.         Wallie Renshaw, RN

## 2013-11-03 NOTE — Progress Notes (Signed)
Assessment/Plan: Active Problems:   CVA (cerebral infarction)   Essential hypertension   Dementia   Dyslipidemia   UTI (lower urinary tract infection)   Chest pain - No MI. I am not sure of cardiology plan but patient is NPO "for stress test". Will wait to see if further workup planned. If not, could get back to SNF soon.    Subjective: No further problems overnight. Patient does not recall chest pain.   Objective:  Vital Signs: Filed Vitals:   11/02/13 2200 11/03/13 0420 11/03/13 0736 11/03/13 0810  BP: 128/70 155/87 130/79 133/80  Pulse: 58 62  54  Temp: 97.7 F (36.5 C) 97.5 F (36.4 C)  97.6 F (36.4 C)  TempSrc: Oral Oral  Oral  Resp: 18 18  14   Height:      Weight:  78 kg (171 lb 15.3 oz)    SpO2: 95% 96%  95%     EXAM: alert.    Intake/Output Summary (Last 24 hours) at 11/03/13 0907 Last data filed at 11/02/13 1853  Gross per 24 hour  Intake      0 ml  Output    300 ml  Net   -300 ml    Lab Results:  Recent Labs  11/02/13 1010 11/02/13 1632 11/03/13 0440  NA 139  --  138  K 4.8  --  4.6  CL 104  --  101  CO2 26  --  26  GLUCOSE 95  --  94  BUN 21  --  24*  CREATININE 0.93 1.04 0.94  CALCIUM 8.5  --  8.7  MG  --  2.1  --   PHOS  --  2.8  --     Recent Labs  11/02/13 1010  AST 28  ALT 38  ALKPHOS 64  BILITOT 0.4  PROT 5.7*  ALBUMIN 2.4*   No results found for this basename: LIPASE, AMYLASE,  in the last 72 hours  Recent Labs  11/02/13 1010 11/02/13 1632 11/03/13 0440  WBC 7.4 9.3 7.5  NEUTROABS 5.2  --   --   HGB 13.1 13.3 13.9  HCT 38.5* 39.7 39.7  MCV 94.4 94.3 93.2  PLT 188 203 211    Recent Labs  11/02/13 1632 11/02/13 2230  TROPONINI <0.30 <0.30   BNP No results found for this basename: probnp   No results found for this basename: DDIMER,  in the last 72 hours No results found for this basename: HGBA1C,  in the last 72 hours No results found for this basename: CHOL, HDL, LDLCALC, TRIG, CHOLHDL, LDLDIRECT,  in  the last 72 hours  Recent Labs  11/02/13 1632  TSH 2.540   No results found for this basename: VITAMINB12, FOLATE, FERRITIN, TIBC, IRON, RETICCTPCT,  in the last 72 hours  Studies/Results: Dg Chest Port 1 View  11/02/2013   CLINICAL DATA:  Chest pain and cough  EXAM: PORTABLE CHEST - 1 VIEW  COMPARISON:  DG CHEST 1V PORT dated 10/25/2013  FINDINGS: The heart size and mediastinal contours are within normal limits. Both lungs are clear. The visualized skeletal structures are unremarkable. Right hemidiaphragmatic eventration reidentified. Bilateral glenohumeral joint degenerative change identified. Electrode projects over the left heart border.  IMPRESSION: No acute cardiopulmonary process.   Electronically Signed   By: Conchita Paris M.D.   On: 11/02/2013 10:05   Medications: Medications administered in the last 24 hours reviewed.  Current Medication List reviewed.    LOS: 1 day   Genelle Bal  [facility] @ [facility] [phone]) 11/03/2013, 9:07 AM

## 2013-11-03 NOTE — Progress Notes (Signed)
Patient ID: Trevor Lutz, male   DOB: 30-Oct-1921, 80 y.o.   MRN: [id number]    Subjective:  Denies SSCP, palpitations or Dyspnea Spoke with caretaker   Objective:  Filed Vitals:   11/02/13 2200 11/03/13 0420 11/03/13 0736 11/03/13 0810  BP: 128/70 155/87 130/79 133/80  Pulse: 58 62  54  Temp: 97.7 F (36.5 C) 97.5 F (36.4 C)  97.6 F (36.4 C)  TempSrc: Oral Oral  Oral  Resp: 18 18  14   Height:      Weight:  171 lb 15.3 oz (78 kg)    SpO2: 95% 96%  95%    Intake/Output from previous day:  Intake/Output Summary (Last 24 hours) at 11/03/13 9833 Last data filed at 11/02/13 1853  Gross per 24 hour  Intake      0 ml  Output    300 ml  Net   -300 ml    Physical Exam: Affect appropriate Healthy:  appears stated age Dementia  Neck supple with no adenopathy JVP normal no bruits no thyromegaly Lungs clear with no wheezing and good diaphragmatic motion Heart:  S1/S2 no murmur, no rub, gallop or click PMI normal Abdomen: benighn, BS positve, no tenderness, no AAA no bruit.  No HSM or HJR Distal pulses intact with no bruits No edema Right hemiparesis  Skin warm and dry    Lab Results: Basic Metabolic Panel:  Recent Labs  11/02/13 1010 11/02/13 1632 11/03/13 0440  NA 139  --  138  K 4.8  --  4.6  CL 104  --  101  CO2 26  --  26  GLUCOSE 95  --  94  BUN 21  --  24*  CREATININE 0.93 1.04 0.94  CALCIUM 8.5  --  8.7  MG  --  2.1  --   PHOS  --  2.8  --    Liver Function Tests:  Recent Labs  11/02/13 1010  AST 28  ALT 38  ALKPHOS 64  BILITOT 0.4  PROT 5.7*  ALBUMIN 2.4*   No results found for this basename: LIPASE, AMYLASE,  in the last 72 hours CBC:  Recent Labs  11/02/13 1010 11/02/13 1632 11/03/13 0440  WBC 7.4 9.3 7.5  NEUTROABS 5.2  --   --   HGB 13.1 13.3 13.9  HCT 38.5* 39.7 39.7  MCV 94.4 94.3 93.2  PLT 188 203 211   Cardiac Enzymes:  Recent Labs  11/02/13 1632 11/02/13 2230  TROPONINI <0.30 <0.30   Thyroid Function  Tests:  Recent Labs  11/02/13 1632  TSH 2.540    Imaging: Imaging results have been reviewed  Cardiac Studies:  ECG:  SR rate 59 normal   Telemetry:  NSR no arrhythmia   Echo: 10/23/13 TEE EF 50-55% no discrete RWMA  Medications:   . amLODipine  5 mg Oral QAC breakfast  . cefTRIAXone (ROCEPHIN)  IV  1 g Intravenous Q24H  . cholecalciferol  1,000 Units Oral BH-q7a  . citalopram  10 mg Oral BH-q7a  . clopidogrel  75 mg Oral Q breakfast  . divalproex  250 mg Oral BID  . enoxaparin (LOVENOX) injection  40 mg Subcutaneous Q24H  . galantamine  12 mg Oral BID  . memantine  10 mg Oral BID  . multivitamin with minerals  1 tablet Oral Daily  . simvastatin  20 mg Oral QPM  . tamsulosin  0.4 mg Oral QPC breakfast  . vitamin B-12  100 mcg Oral BH-q7a  . vitamin  C  500 mg Oral Daily       Assessment/Plan:  Chest Pain: Never complained by patient.  R/O normal ECG Per Dr Bensimohn's note no need for myovue or stress testing D/C back to SNF Not  Clear why he is NPO but told nurse to feed Stroke:  Continue plavix no source of embolus on TEE HTN:  Continue amlodipine   Josue Hector 11/03/2013, 9:26 AM

## 2013-11-03 NOTE — Progress Notes (Signed)
Report given to Kathlee Nations, Therapist, sports at [facility]

## 2013-11-03 NOTE — Clinical Social Work Psychosocial (Signed)
Clinical Social Work Department BRIEF PSYCHOSOCIAL ASSESSMENT 11/03/2013  Patient:  Trevor Lutz, Trevor Lutz     Account Number:  [id number]     Admit date:  11/02/2013  Clinical Social Worker:  Hubert Azure  Date/Time:  11/03/2013 02:52 PM  Referred by:  Physician  Date Referred:  11/03/2013 Referred for  SNF Placement   Other Referral:   Interview type:  Other - See comment Other interview type:   CSW spoke with patient's caregiver Francee Gentile) who was present at bedside.    PSYCHOSOCIAL DATA Living Status:  FAMILY Admitted from facility:  [place] Level of care:  [place] Primary support name:  Khale Nigh Primary support relationship to patient:  SPOUSE Degree of support available:   Good.    CURRENT CONCERNS Current Concerns  Post-Acute Placement   Other Concerns:    SOCIAL WORK ASSESSMENT / PLAN CSW met with patient's caregiver Francee Gentile who was present at bedside. CSW introduced self and explained role. CSW further discussed d/c plan with [facility]. Per Rise Paganini, patient was at [facility] for one week before being hospitalized. Rise Paganini stated patient resided at home prior to going to [facility] Per Rise Paganini, patient's wife lives at home, but is Magazine features editor of the [employer], so she travels the world a lot. Rise Paganini stated she has been caregiver for patient for one year. Patient was ambulating without assistance prior to having a mild stroke. Rise Paganini stated patient's only problem is dementia.   Assessment/plan status:  No Further Intervention Required Other assessment/ plan:   CSW to update FL2 for return to SNF.   Information/referral to community resources:    PATIENT'S/FAMILY'S RESPONSE TO PLAN OF CARE: Rise Paganini thanked CSW for assistance with d/c plan.   [place], [place] Weekend Clinical Social Worker [phone]

## 2013-11-03 NOTE — Discharge Summary (Signed)
Physician Discharge Summary  NAME:Trevor Lutz  ZDG:[id number]  DOB: 08/14/1921   Admit date: 11/02/2013 Discharge date: 11/03/2013  Admitting Diagnosis: chest pain  Discharge Diagnoses:  Active Hospital Problems   Diagnosis Date Noted  . Chest pain 11/02/2013  . UTI (lower urinary tract infection) 11/02/2013  . Dyslipidemia 10/21/2013  . Essential hypertension 10/20/2013  . Dementia 10/20/2013  . CVA (cerebral infarction) 10/20/2013    Resolved Hospital Problems   Diagnosis Date Noted Date Resolved  No resolved problems to display.    Things to follow up in the outpatient setting: Check for sensitivities on urine culture  Hospital Course: Patient admitted with about 10 minutes of chest pain. Apparently had sweating and relief with NTG, however, patient does not recall any of it. Seen by cardiology and, after MI ruled out, no further evaluation was recommended.   He had pyuria on admit and was given one dose of ceftriazone and is being changed to cipro at d/c. Culture and sensitivity are pending.   Discharge Condition: improved  Consults: cardiology  Disposition: 03-Skilled Nursing Facility  Discharge Instructions   Diet - low sodium heart healthy    Complete by:  As directed      Increase activity slowly    Complete by:  As directed             Medication List    STOP taking these medications       diphenhydramine-acetaminophen 25-500 MG Tabs  Commonly known as:  TYLENOL PM      TAKE these medications       amLODipine 5 MG tablet  Commonly known as:  NORVASC  Take 5 mg by mouth daily before breakfast.     cholecalciferol 1000 UNITS tablet  Commonly known as:  VITAMIN D  Take 1,000 Units by mouth every morning.     ciprofloxacin 500 MG tablet  Commonly known as:  CIPRO  Take 1 tablet (500 mg total) by mouth 2 (two) times daily.     citalopram 10 MG tablet  Commonly known as:  CELEXA  Take 10 mg by mouth every morning.     clopidogrel 75 MG tablet   Commonly known as:  PLAVIX  Take 75 mg by mouth daily with breakfast.     divalproex 250 MG DR tablet  Commonly known as:  DEPAKOTE  Take 250 mg by mouth 2 (two) times daily.     galantamine 12 MG tablet  Commonly known as:  RAZADYNE  Take 12 mg by mouth 2 (two) times daily.     ibuprofen 200 MG tablet  Commonly known as:  ADVIL,MOTRIN  Take 400 mg by mouth every 8 (eight) hours as needed for mild pain.     l-methylfolate-b2-b6-b12 11-12-48-5 MG Tabs  Commonly known as:  CEREFOLIN  Take 1 tablet by mouth every morning.     memantine 10 MG tablet  Commonly known as:  NAMENDA  Take 10 mg by mouth 2 (two) times daily.     multivitamin tablet  Take 1 tablet by mouth every morning.     oxybutynin 5 MG tablet  Commonly known as:  DITROPAN  Take 5 mg by mouth 2 (two) times daily as needed for bladder spasms.     simvastatin 20 MG tablet  Commonly known as:  ZOCOR  Take 20 mg by mouth every evening.     tamsulosin 0.4 MG Caps capsule  Commonly known as:  FLOMAX  Take 0.4 mg by mouth daily after breakfast.  vitamin B-12 100 MCG tablet  Commonly known as:  CYANOCOBALAMIN  Take 100 mcg by mouth every morning.     vitamin C 500 MG tablet  Commonly known as:  ASCORBIC ACID  Take 500 mg by mouth daily.         Time coordinating discharge: 25 minutes including medication reconciliation, preparation of discharge papers, and discussion with patient and staff    Signed: Horton Finer 11/03/2013, 9:58 AM

## 2013-11-03 NOTE — Clinical Social Work Note (Addendum)
CSW informed patient is ready for D/C to SNF bed at [facility]. CSW contacted [facility] and spoke with Hilda Blades who confirmed bed availability. CSW spoke with patient caregiver and family who is agreeable to plans. CSW faxed d/c summary to [facility]. CSW made RN aware of bed and report number. CSW to arrange transportation via [place]. No further needs. CSW signing off.                     [place], [place] Weekend Clinical Social Worker [phone]

## 2013-11-04 LAB — URINE CULTURE

## 2013-11-06 ENCOUNTER — Non-Acute Institutional Stay (SKILLED_NURSING_FACILITY): Payer: Medicare PPO | Admitting: Internal Medicine

## 2013-11-06 ENCOUNTER — Encounter: Payer: Self-pay | Admitting: Internal Medicine

## 2013-11-06 DIAGNOSIS — N401 Enlarged prostate with lower urinary tract symptoms: Secondary | ICD-10-CM

## 2013-11-06 DIAGNOSIS — I6350 Cerebral infarction due to unspecified occlusion or stenosis of unspecified cerebral artery: Secondary | ICD-10-CM

## 2013-11-06 DIAGNOSIS — N138 Other obstructive and reflux uropathy: Secondary | ICD-10-CM

## 2013-11-06 DIAGNOSIS — I10 Essential (primary) hypertension: Secondary | ICD-10-CM

## 2013-11-06 DIAGNOSIS — I69320 Aphasia following cerebral infarction: Secondary | ICD-10-CM

## 2013-11-06 DIAGNOSIS — N390 Urinary tract infection, site not specified: Secondary | ICD-10-CM

## 2013-11-06 DIAGNOSIS — N400 Enlarged prostate without lower urinary tract symptoms: Secondary | ICD-10-CM

## 2013-11-06 DIAGNOSIS — G8190 Hemiplegia, unspecified affecting unspecified side: Secondary | ICD-10-CM

## 2013-11-06 DIAGNOSIS — I639 Cerebral infarction, unspecified: Secondary | ICD-10-CM

## 2013-11-06 DIAGNOSIS — I69920 Aphasia following unspecified cerebrovascular disease: Secondary | ICD-10-CM

## 2013-11-06 DIAGNOSIS — G8191 Hemiplegia, unspecified affecting right dominant side: Secondary | ICD-10-CM

## 2013-11-06 DIAGNOSIS — F0390 Unspecified dementia without behavioral disturbance: Secondary | ICD-10-CM

## 2013-11-06 NOTE — Progress Notes (Signed)
Patient ID: Trevor Lutz, male   DOB: 1922/01/08, 80 y.o.   MRN: [id number]    Location:  [facility]: SNF (31)  PCP: Irven Shelling, MD; Jeanmarie Hubert, MD while in SNF  Code Status: DNR, LIVING WILL, HCPOA   Extended Emergency Contact Information Primary Emergency Contact: Daire, [place] Address: [address]          [place], [place] [address] Johnnette Litter of [place] Phone: [phone] Mobile Phone: [phone] Relation: Spouse Secondary Emergency Contact: Hollis of [place] Phone: [phone] Relation: Other  No Known Allergies  Chief Complaint  Patient presents with  . Hospitalization Follow-up    Readmission to skilled nursing facility    HPI:  Mr. Attia is a readmission to [facility].  He was initially admitted 10/25/13. He had been hospitalized 5/9/15through 10/25/13 after presenting  with weakness of the right side and slurred speech. After multiple brain scans,he had diagnosis of an acute cerebrovascular accident. There were also areas suggestive of remote cerebral vascular accidents. The acute CVA involved the left frontal area, parietal area, occipital area. Scans also showed what appeared to be an old lacunar infarct in the left basal ganglia.  He was discharged to the skilled nursing facility. He had about 10 minutes of significant chest pain 11/02/13 and was transferred from [facility] to the hospital. Myocardial infarction was ruled out. He was returned to [facility] on 11/03/13.  Patient had problems with urinary retention during both hospitalizations, however catheter has been removed and he has voided since he was readmitted to [facility]. He is to see Dr. Junious Silk, urologist, soon. He remains on Flomax and oxybutynin at this time. He is status post TURP on 11/13/12. He is known to have an enlarged prostate.  Patient is demented. He is currently taking galantamine and  memantine. Dementia does not seem to be much affected by his recent strokes.    Past Medical History  Diagnosis Date  . Hypertension   . BPH (benign prostatic hyperplasia)     HAS INDWELLING FOLEY CATHETER  . Alzheimer disease   . Hyperlipidemia   . Erectile dysfunction   . Mild depression   . Spinal stenosis   . CVA (cerebral vascular accident) 2010    Left basal ganglia  . Acute CVA (cerebrovascular accident) 10/25/13    Left frontal lobe, left parietal lobe, left occipital lobe  . Separation of left acromioclavicular joint, type 3 2003  . Hemiparesis, right 11/07/2013  . Aphasia due to recent cerebral infarction 11/07/2013  . Hypertrophy of prostate with urinary obstruction and other lower urinary tract symptoms (LUTS) 11/07/2013  . Right inguinal hernia 09/26/2012    Past Surgical History  Procedure Laterality Date  . Kidney surgery      right kidney removed due  to traumatic injury  . Tonsilectomy, adenoidectomy, bilateral myringotomy and tubes    . Hernia repair Left 09/24/1996    Indirect, Dr Lucia Gaskins surgeon  . Transurethral resection of prostate N/A 11/13/2012    Procedure: BI POLAR TRANSURETHRAL RESECTION OF THE PROSTATE (TURP);  Surgeon: Fredricka Bonine, MD;  Location: [facility] ORS;  Service: Urology;  Laterality: N/A;  . Tee without cardioversion N/A 10/23/2013    Procedure: TRANSESOPHAGEAL ECHOCARDIOGRAM (TEE);  Surgeon: Thayer Headings, MD;  Location: [facility] ENDOSCOPY;  Service: Cardiovascular;  Laterality: N/A;  . Loop recorder implant  10-24-2013    MDT LinQ implanted by Dr Caryl Comes for cryptogenic stroke  .  Orif acromioclavicular joint Left 2003    CONSULTANTS Neurology: Love/ [place]/ Krista Blue Urology: [place] surgery: Margot Chimes PCP: Electa Sniff Orthopedics: [place]/ [place] Cardiology: Caryl Comes  PAST PROCEDURES 07/08/99 ultrasound of the abdomen: Cholelithiasis. Prior right nephrectomy for trauma. 06/10/02 left shoulder : Prior [facility] joint repair 11/17/10 MRI  brain: CVA  10/20/13 CT of the brain:   1. Negative for bleed or other acute intracranial process.  2. Old left basal ganglia/corona radiata and left occipital  infarcts.  3. Atrophy and nonspecific white matter changes.  10/20/13 MRI/ MRA HEAD WITHOUT CONTRAST:  MRI HEAD FINDINGS   The diffusion-weighted images demonstrate acute nonhemorrhagic infarcts extending through the posterior left frontal and parietal lobes within a watershed distribution. There additional foci of infarct extending more inferiorly in the left parietal lobe, potentially along the left MCA and PCA watershed territory.   A remote left MCA territory infarct involving the left frontal operculum is similar to the prior exam. New white matter changes are associated with the acute/subacute infarct. Extensive white matter changes are otherwise similar to the prior study. Moderate generalized atrophy and periventricular white matter changes are present bilaterally. A lacunar infarct in the left thalamus is new since prior study but is not acute.   A medial left occipital lobe infarct is also new since the prior study, but not acute.   Flow is present in the major intracranial arteries. The globes and orbits are intact. A prominent polyp or mucous retention cyst in the right frontal sinus is slightly larger than on the prior exam. Mild mucosal thickening is present throughout the paranasal sinuses. There is some fluid in the mastoid air cells bilaterally. No obstructing nasopharyngeal lesion is evident.   MRA HEAD FINDINGS   The time-of-flight images are somewhat degraded by patient motion. The internal carotid arteries are within normal limits from the high cervical segments through the ICA termini. The A1 and M1 segments are intact. No definite anterior communicating artery is evident. The MCA bifurcations are intact. There is some attenuation of MCA branch vessels bilaterally.   The left vertebral artery is  the dominant vessel. The PICA origins are not visualized of the there is poor signal over the proximal vertebral arteries, likely artifactual. The basilar artery is intact. The posterior cerebral arteries originate from the basilar tip. The left posterior cerebral artery is occluded. There is some attenuation of distal right PCA branch vessels.   IMPRESSION: 1. Acute nonhemorrhagic infarcts within the left frontal and parietal lobe appear to be within a watershed distribution. 2. Additional acute nonhemorrhagic infarcts within the left parietal and occipital lobe are likely watershed type infarcts as well. 3. Proximal occlusion of the left posterior cerebral artery. 4. Stable remote left MCA territory infarct within the left frontal operculum. 5. Medial left occipital lobe infarct and left thalamic infarcts are is new since the prior study but not acute. 6. Mild to moderate small vessel disease throughout the circle of Willis.  10/22/13 Carotid Duplex Study: Bilateral: mild calcific plaque origin ICA. 1-39% ICA stenosis. Vertebral artery flow is antegrade.  10/24/13 implantation of loop recorder in the left parasternal area  10/23/13 TEE : Transesophageal Echocardiography: ------------------------------------------------------------ LV EF: 50% -   55% ------------------------------------------------------------ Study Conclusions  - Left ventricle: Systolic function was normal. The   estimated ejection fraction was in the range of 50% to   55%. - Aortic valve: No evidence of vegetation. - Mitral valve: Mild regurgitation. - Left atrium: No evidence of thrombus in the atrial cavity  or appendage. - Atrial septum: No defect or patent foramen ovale was   identified.  Transesophageal echocardiography.  2D and color Doppler. ------------------------------------------------------------ Left ventricle:  Systolic function was normal. The estimated ejection fraction was in the range  of 50% to 55%.  ------------------------------------------------------------ Aortic valve:   Structurally normal valve.   Cusp separation was normal.  No evidence of vegetation.  Doppler:   No regurgitation.  ------------------------------------------------------------ Mitral valve:   Doppler:   Mild regurgitation.  ------------------------------------------------------------ Left atrium:   No evidence of thrombus in the atrial cavity or appendage.  ------------------------------------------------------------ Atrial septum:  No ASD or PFO by color duplex or bubble study. No defect or patent foramen ovale was identified     Social History: History   Social History  . Marital Status: Married    Spouse Name: N/A    Number of Children: N/A  . Years of Education: N/A   Social History Main Topics  . Smoking status: Former Smoker    Types: Cigarettes  . Smokeless tobacco: None     Comment: QUIT SMOKING MANY YEARS AGO  . Alcohol Use: 1.2 oz/week    2 Glasses of wine per week     Comment: 2 glasses a week  . Drug Use: No  . Sexual Activity: No   Other Topics Concern  . None   Social History Narrative  . None    Family History Family Status  Relation Status Death Age  . Father Deceased   . Mother Deceased    Family History  Problem Relation Age of Onset  . Heart disease Father   . Heart disease Mother      Medications: Patient's Medications  New Prescriptions   No medications on file  Previous Medications   AMLODIPINE (NORVASC) 5 MG TABLET    Take 5 mg by mouth daily before breakfast.    CHOLECALCIFEROL (VITAMIN D) 1000 UNITS TABLET    Take 1,000 Units by mouth every morning.    CIPROFLOXACIN (CIPRO) 500 MG TABLET    Take 1 tablet (500 mg total) by mouth 2 (two) times daily.   CITALOPRAM (CELEXA) 10 MG TABLET    Take 10 mg by mouth every morning.    CLOPIDOGREL (PLAVIX) 75 MG TABLET    Take 75 mg by mouth daily with breakfast.   DIVALPROEX (DEPAKOTE) 250  MG DR TABLET    Take 250 mg by mouth 2 (two) times daily.    GALANTAMINE (RAZADYNE) 12 MG TABLET    Take 12 mg by mouth 2 (two) times daily.   IBUPROFEN (ADVIL,MOTRIN) 200 MG TABLET    Take 400 mg by mouth every 8 (eight) hours as needed for mild pain.   L-METHYLFOLATE-B2-B6-B12 (CEREFOLIN) 11-12-48-5 MG TABS    Take 1 tablet by mouth every morning.    MEMANTINE (NAMENDA) 10 MG TABLET    Take 10 mg by mouth 2 (two) times daily.   MULTIPLE VITAMIN (MULTIVITAMIN) TABLET    Take 1 tablet by mouth every morning.    OXYBUTYNIN (DITROPAN) 5 MG TABLET    Take 5 mg by mouth 2 (two) times daily as needed for bladder spasms.   SIMVASTATIN (ZOCOR) 20 MG TABLET    Take 20 mg by mouth every evening.   TAMSULOSIN (FLOMAX) 0.4 MG CAPS    Take 0.4 mg by mouth daily after breakfast.    VITAMIN B-12 (CYANOCOBALAMIN) 100 MCG TABLET    Take 100 mcg by mouth every morning.    VITAMIN C (ASCORBIC ACID)  500 MG TABLET    Take 500 mg by mouth daily.  Modified Medications   No medications on file  Discontinued Medications   No medications on file     There is no immunization history on file for this patient.   Review of Systems  Constitutional: Positive for activity change and fatigue. Negative for chills, diaphoresis and appetite change.  HENT: Negative for ear pain, hearing loss, mouth sores, rhinorrhea, sore throat, trouble swallowing and voice change.   Eyes: Negative.   Respiratory: Negative for chest tightness, shortness of breath and wheezing.   Cardiovascular: Negative for chest pain, palpitations and leg swelling.  Gastrointestinal: Negative for nausea, abdominal pain, diarrhea, constipation and abdominal distention.  Endocrine: Negative.   Genitourinary: Positive for frequency and difficulty urinating.  Musculoskeletal: Positive for gait problem.       Weak right side. Drifts to the left when walking. Needs assistance of one when walking due to risk of falling.  Skin:       Itching on back    Neurological: Positive for weakness (right hemiparesis). Negative for tremors and seizures.       Recent acute CVA  Hematological: Negative.   Psychiatric/Behavioral: Positive for confusion.      Filed Vitals:   11/06/13 1657  BP: 106/69  Pulse: 69  Temp: 97.5 F (36.4 C)  Resp: 14  Height: _0  (1.702 m)  Weight: 167 lb (75.751 kg)  SpO2: 96%   Body mass index is 26.15 kg/(m^2).  Physical Exam  Constitutional: He appears well-developed and well-nourished. No distress.  HENT:  Right Ear: External ear normal.  Left Ear: External ear normal.  Nose: Nose normal.  Mouth/Throat: Oropharynx is clear and moist. No oropharyngeal exudate.  Eyes: Conjunctivae and EOM are normal. Pupils are equal, round, and reactive to light.  Neck: No JVD present. No tracheal deviation present. No thyromegaly present.  Cardiovascular: Normal rate, regular rhythm, normal heart sounds and intact distal pulses.  Exam reveals no gallop and no friction rub.   No murmur heard. Pulmonary/Chest: No respiratory distress. He has no wheezes. He has no rales. He exhibits no tenderness.  Loop recorder implanted in left parasternal area.  Abdominal: There is no tenderness. There is no rebound and no guarding.  Large right inguinal hernia.  Lymphadenopathy:    He has no cervical adenopathy.  Neurological: He is alert. He displays abnormal reflex. No cranial nerve deficit. Coordination abnormal.  Skin:  Large bruise in the right lower abdomen. Scratched add bleeding at several spots.  Psychiatric: He has a normal mood and affect. His behavior is normal. Thought content normal.        Labs reviewed: Admission on 11/02/2013, Discharged on 11/03/2013  Component Date Value Ref Range Status  . Specimen Description 11/02/2013 BLOOD ARM RIGHT   Final  . Special Requests 11/02/2013 BOTTLES DRAWN AEROBIC AND ANAEROBIC 5CC   Final  . Culture  Setup Time 11/02/2013    Final                   Value:11/02/2013  14:21                         Performed at [facility]  . Culture 11/02/2013    Final                   Value:       BLOOD CULTURE RECEIVED NO GROWTH TO DATE CULTURE WILL  BE HELD FOR 5 DAYS BEFORE ISSUING A FINAL NEGATIVE REPORT                         Performed at [facility]  . Report Status 11/02/2013 PENDING   Incomplete  . Specimen Description 11/02/2013 BLOOD HAND RIGHT   Final  . Special Requests 11/02/2013 BOTTLES DRAWN AEROBIC AND ANAEROBIC 5CC   Final  . Culture  Setup Time 11/02/2013    Final                   Value:11/02/2013 14:22                         Performed at [facility]  . Culture 11/02/2013    Final                   Value:       BLOOD CULTURE RECEIVED NO GROWTH TO DATE CULTURE WILL BE HELD FOR 5 DAYS BEFORE ISSUING A FINAL NEGATIVE REPORT                         Performed at [facility]  . Report Status 11/02/2013 PENDING   Incomplete  . WBC 11/02/2013 7.4  4.0 - 10.5 K/uL Final  . RBC 11/02/2013 4.08* 4.22 - 5.81 MIL/uL Final  . Hemoglobin 11/02/2013 13.1  13.0 - 17.0 g/dL Final  . HCT 11/02/2013 38.5* 39.0 - 52.0 % Final  . MCV 11/02/2013 94.4  78.0 - 100.0 fL Final  . MCH 11/02/2013 32.1  26.0 - 34.0 pg Final  . MCHC 11/02/2013 34.0  30.0 - 36.0 g/dL Final  . RDW 11/02/2013 14.5  11.5 - 15.5 % Final  . Platelets 11/02/2013 188  150 - 400 K/uL Final  . Neutrophils Relative % 11/02/2013 69  43 - 77 % Final  . Neutro Abs 11/02/2013 5.2  1.7 - 7.7 K/uL Final  . Lymphocytes Relative 11/02/2013 16  12 - 46 % Final  . Lymphs Abs 11/02/2013 1.2  0.7 - 4.0 K/uL Final  . Monocytes Relative 11/02/2013 9  3 - 12 % Final  . Monocytes Absolute 11/02/2013 0.6  0.1 - 1.0 K/uL Final  . Eosinophils Relative 11/02/2013 6* 0 - 5 % Final  . Eosinophils Absolute 11/02/2013 0.4  0.0 - 0.7 K/uL Final  . Basophils Relative 11/02/2013 0  0 - 1 % Final  . Basophils Absolute 11/02/2013 0.0  0.0 - 0.1 K/uL Final  . Sodium 11/02/2013 139  137 - 147  mEq/L Final  . Potassium 11/02/2013 4.8  3.7 - 5.3 mEq/L Final  . Chloride 11/02/2013 104  96 - 112 mEq/L Final  . CO2 11/02/2013 26  19 - 32 mEq/L Final  . Glucose, Bld 11/02/2013 95  70 - 99 mg/dL Final  . BUN 11/02/2013 21  6 - 23 mg/dL Final  . Creatinine, Ser 11/02/2013 0.93  0.50 - 1.35 mg/dL Final  . Calcium 11/02/2013 8.5  8.4 - 10.5 mg/dL Final  . Total Protein 11/02/2013 5.7* 6.0 - 8.3 g/dL Final  . Albumin 11/02/2013 2.4* 3.5 - 5.2 g/dL Final  . AST 11/02/2013 28  0 - 37 U/L Final  . ALT 11/02/2013 38  0 - 53 U/L Final  . Alkaline Phosphatase 11/02/2013 64  39 - 117 U/L Final  . Total Bilirubin 11/02/2013 0.4  0.3 - 1.2 mg/dL Final  .  GFR calc non Af Amer 11/02/2013 72* >90 mL/min Final  . GFR calc Af Amer 11/02/2013 84* >90 mL/min Final   Comment: (NOTE)                          The eGFR has been calculated using the CKD EPI equation.                          This calculation has not been validated in all clinical situations.                          eGFR's persistently <90 mL/min signify possible Chronic Kidney                          Disease.  . Lactic Acid, Venous 11/02/2013 0.91  0.5 - 2.2 mmol/L Final  . Color, Urine 11/02/2013 YELLOW  YELLOW Final  . APPearance 11/02/2013 CLOUDY* CLEAR Final  . Specific Gravity, Urine 11/02/2013 1.020  1.005 - 1.030 Final  . pH 11/02/2013 7.5  5.0 - 8.0 Final  . Glucose, UA 11/02/2013 NEGATIVE  NEGATIVE mg/dL Final  . Hgb urine dipstick 11/02/2013 MODERATE* NEGATIVE Final  . Bilirubin Urine 11/02/2013 NEGATIVE  NEGATIVE Final  . Ketones, ur 11/02/2013 NEGATIVE  NEGATIVE mg/dL Final  . Protein, ur 11/02/2013 30* NEGATIVE mg/dL Final  . Urobilinogen, UA 11/02/2013 1.0  0.0 - 1.0 mg/dL Final  . Nitrite 11/02/2013 POSITIVE* NEGATIVE Final  . Leukocytes, UA 11/02/2013 LARGE* NEGATIVE Final  . Specimen Description 11/02/2013 URINE, CATHETERIZED   Final  . Special Requests 11/02/2013 NONE   Final  . Culture  Setup Time 11/02/2013     Final                   Value:11/02/2013 14:43                         Performed at [facility]  . Colony Count 11/02/2013    Final                   Value:>=100,000 COLONIES/ML                         Performed at [facility]  . Culture 11/02/2013    Final                   Value:ESCHERICHIA COLI                         Performed at [facility]  . Report Status 11/02/2013 11/04/2013 FINAL   Final  . Organism ID, Bacteria 11/02/2013 ESCHERICHIA COLI   Final  . Troponin i, poc 11/02/2013 0.00  0.00 - 0.08 ng/mL Final  . Comment 3 11/02/2013          Final   Comment: Due to the release kinetics of cTnI,                          a negative result within the first hours                          of the onset of symptoms does not rule out  myocardial infarction with certainty.                          If myocardial infarction is still suspected,                          repeat the test at appropriate intervals.  . Squamous Epithelial / LPF 11/02/2013 RARE  RARE Final  . WBC, UA 11/02/2013 TOO NUMEROUS TO COUNT  <3 WBC/hpf Final  . RBC / HPF 11/02/2013 7-10  <3 RBC/hpf Final  . Bacteria, UA 11/02/2013 MANY* RARE Final  . Troponin I 11/02/2013 <0.30  <0.30 ng/mL Final   Comment:                                 Due to the release kinetics of cTnI,                          a negative result within the first hours                          of the onset of symptoms does not rule out                          myocardial infarction with certainty.                          If myocardial infarction is still suspected,                          repeat the test at appropriate intervals.  . Troponin I 11/02/2013 <0.30  <0.30 ng/mL Final   Comment:                                 Due to the release kinetics of cTnI,                          a negative result within the first hours                          of the onset of symptoms does not rule out                           myocardial infarction with certainty.                          If myocardial infarction is still suspected,                          repeat the test at appropriate intervals.  . WBC 11/02/2013 9.3  4.0 - 10.5 K/uL Final  . RBC 11/02/2013 4.21* 4.22 - 5.81 MIL/uL Final  . Hemoglobin 11/02/2013 13.3  13.0 - 17.0 g/dL Final  . HCT 11/02/2013 39.7  39.0 - 52.0 % Final  . MCV 11/02/2013 94.3  78.0 - 100.0 fL Final  . MCH 11/02/2013 31.6  26.0 - 34.0 pg Final  . MCHC 11/02/2013 33.5  30.0 -  36.0 g/dL Final  . RDW 11/02/2013 14.7  11.5 - 15.5 % Final  . Platelets 11/02/2013 203  150 - 400 K/uL Final  . Creatinine, Ser 11/02/2013 1.04  0.50 - 1.35 mg/dL Final  . GFR calc non Af Amer 11/02/2013 62* >90 mL/min Final  . GFR calc Af Amer 11/02/2013 71* >90 mL/min Final   Comment: (NOTE)                          The eGFR has been calculated using the CKD EPI equation.                          This calculation has not been validated in all clinical situations.                          eGFR's persistently <90 mL/min signify possible Chronic Kidney                          Disease.  Marland Kitchen TSH 11/02/2013 2.540  0.350 - 4.500 uIU/mL Final   Please note change in reference range.  . Magnesium 11/02/2013 2.1  1.5 - 2.5 mg/dL Final  . Phosphorus 11/02/2013 2.8  2.3 - 4.6 mg/dL Final  . MRSA by PCR 11/02/2013 NEGATIVE  NEGATIVE Final   Comment:                                 The GeneXpert MRSA Assay (FDA                          approved for NASAL specimens                          only), is one component of a                          comprehensive MRSA colonization                          surveillance program. It is not                          intended to diagnose MRSA                          infection nor to guide or                          monitor treatment for                          MRSA infections.  . WBC 11/03/2013 7.5  4.0 - 10.5 K/uL Final  . RBC 11/03/2013 4.26  4.22 -  5.81 MIL/uL Final  . Hemoglobin 11/03/2013 13.9  13.0 - 17.0 g/dL Final  . HCT 11/03/2013 39.7  39.0 - 52.0 % Final  . MCV 11/03/2013 93.2  78.0 - 100.0 fL Final  . MCH 11/03/2013 32.6  26.0 - 34.0 pg Final  . MCHC 11/03/2013 35.0  30.0 - 36.0 g/dL Final  . RDW 11/03/2013 14.2  11.5 - 15.5 % Final  . Platelets 11/03/2013 211  150 - 400 K/uL Final  . Sodium 11/03/2013 138  137 - 147 mEq/L Final  . Potassium 11/03/2013 4.6  3.7 - 5.3 mEq/L Final  . Chloride 11/03/2013 101  96 - 112 mEq/L Final  . CO2 11/03/2013 26  19 - 32 mEq/L Final  . Glucose, Bld 11/03/2013 94  70 - 99 mg/dL Final  . BUN 11/03/2013 24* 6 - 23 mg/dL Final  . Creatinine, Ser 11/03/2013 0.94  0.50 - 1.35 mg/dL Final  . Calcium 11/03/2013 8.7  8.4 - 10.5 mg/dL Final  . GFR calc non Af Amer 11/03/2013 72* >90 mL/min Final  . GFR calc Af Amer 11/03/2013 83* >90 mL/min Final   Comment: (NOTE)                          The eGFR has been calculated using the CKD EPI equation.                          This calculation has not been validated in all clinical situations.                          eGFR's persistently <90 mL/min signify possible Chronic Kidney                          Disease.  Admission on 10/20/2013, Discharged on 10/25/2013  Component Date Value Ref Range Status  . Alcohol, Ethyl (B) 10/20/2013 <11  0 - 11 mg/dL Final   Comment:                                 LOWEST DETECTABLE LIMIT FOR                          SERUM ALCOHOL IS 11 mg/dL                          FOR MEDICAL PURPOSES ONLY  . Sodium 10/20/2013 141  137 - 147 mEq/L Final  . Potassium 10/20/2013 4.5  3.7 - 5.3 mEq/L Final  . Chloride 10/20/2013 101  96 - 112 mEq/L Final  . BUN 10/20/2013 20  6 - 23 mg/dL Final  . Creatinine, Ser 10/20/2013 1.30  0.50 - 1.35 mg/dL Final  . Glucose, Bld 10/20/2013 103* 70 - 99 mg/dL Final  . Calcium, Ion 10/20/2013 1.14  1.13 - 1.30 mmol/L Final  . TCO2 10/20/2013 26  0 - 100 mmol/L Final  . Hemoglobin 10/20/2013  15.3  13.0 - 17.0 g/dL Final  . HCT 10/20/2013 45.0  39.0 - 52.0 % Final  . Troponin i, poc 10/20/2013 0.01  0.00 - 0.08 ng/mL Final  . Comment 3 10/20/2013          Final   Comment: Due to the release kinetics of cTnI,                          a negative result within the first hours                          of the onset of symptoms does  not rule out                          myocardial infarction with certainty.                          If myocardial infarction is still suspected,                          repeat the test at appropriate intervals.  . Prothrombin Time 10/20/2013 13.3  11.6 - 15.2 seconds Final  . INR 10/20/2013 1.03  0.00 - 1.49 Final  . aPTT 10/20/2013 28  24 - 37 seconds Final  . WBC 10/20/2013 5.1  4.0 - 10.5 K/uL Final  . RBC 10/20/2013 4.59  4.22 - 5.81 MIL/uL Final  . Hemoglobin 10/20/2013 14.7  13.0 - 17.0 g/dL Final  . HCT 10/20/2013 43.7  39.0 - 52.0 % Final  . MCV 10/20/2013 95.2  78.0 - 100.0 fL Final  . MCH 10/20/2013 32.0  26.0 - 34.0 pg Final  . MCHC 10/20/2013 33.6  30.0 - 36.0 g/dL Final  . RDW 10/20/2013 14.4  11.5 - 15.5 % Final  . Platelets 10/20/2013 126* 150 - 400 K/uL Final  . Neutrophils Relative % 10/20/2013 46  43 - 77 % Final  . Neutro Abs 10/20/2013 2.4  1.7 - 7.7 K/uL Final  . Lymphocytes Relative 10/20/2013 34  12 - 46 % Final  . Lymphs Abs 10/20/2013 1.7  0.7 - 4.0 K/uL Final  . Monocytes Relative 10/20/2013 14* 3 - 12 % Final  . Monocytes Absolute 10/20/2013 0.7  0.1 - 1.0 K/uL Final  . Eosinophils Relative 10/20/2013 6* 0 - 5 % Final  . Eosinophils Absolute 10/20/2013 0.3  0.0 - 0.7 K/uL Final  . Basophils Relative 10/20/2013 0  0 - 1 % Final  . Basophils Absolute 10/20/2013 0.0  0.0 - 0.1 K/uL Final  . Sodium 10/20/2013 139  137 - 147 mEq/L Final  . Potassium 10/20/2013 5.0  3.7 - 5.3 mEq/L Final  . Chloride 10/20/2013 102  96 - 112 mEq/L Final  . CO2 10/20/2013 25  19 - 32 mEq/L Final  . Glucose, Bld 10/20/2013 98  70 - 99 mg/dL  Final  . BUN 10/20/2013 19  6 - 23 mg/dL Final  . Creatinine, Ser 10/20/2013 1.05  0.50 - 1.35 mg/dL Final  . Calcium 10/20/2013 8.4  8.4 - 10.5 mg/dL Final  . Total Protein 10/20/2013 6.3  6.0 - 8.3 g/dL Final  . Albumin 10/20/2013 3.2* 3.5 - 5.2 g/dL Final  . AST 10/20/2013 31  0 - 37 U/L Final   HEMOLYSIS AT THIS LEVEL MAY AFFECT RESULT  . ALT 10/20/2013 19  0 - 53 U/L Final  . Alkaline Phosphatase 10/20/2013 51  39 - 117 U/L Final  . Total Bilirubin 10/20/2013 0.3  0.3 - 1.2 mg/dL Final  . GFR calc non Af Amer 10/20/2013 61* >90 mL/min Final  . GFR calc Af Amer 10/20/2013 70* >90 mL/min Final   Comment: (NOTE)                          The eGFR has been calculated using the CKD EPI equation.                          This  calculation has not been validated in all clinical situations.                          eGFR's persistently <90 mL/min signify possible Chronic Kidney                          Disease.  Marland Kitchen Opiates 10/20/2013 NONE DETECTED  NONE DETECTED Final  . Cocaine 10/20/2013 NONE DETECTED  NONE DETECTED Final  . Benzodiazepines 10/20/2013 NONE DETECTED  NONE DETECTED Final  . Amphetamines 10/20/2013 NONE DETECTED  NONE DETECTED Final  . Tetrahydrocannabinol 10/20/2013 NONE DETECTED  NONE DETECTED Final  . Barbiturates 10/20/2013 NONE DETECTED  NONE DETECTED Final   Comment:                                 DRUG SCREEN FOR MEDICAL PURPOSES                          ONLY.  IF CONFIRMATION IS NEEDED                          FOR ANY PURPOSE, NOTIFY LAB                          WITHIN 5 DAYS.                                                          LOWEST DETECTABLE LIMITS                          FOR URINE DRUG SCREEN                          Drug Class       Cutoff (ng/mL)                          Amphetamine      1000                          Barbiturate      200                          Benzodiazepine   200                          Tricyclics       921                           Opiates          300                          Cocaine          300  THC              50  . Color, Urine 10/20/2013 YELLOW  YELLOW Final  . APPearance 10/20/2013 CLEAR  CLEAR Final  . Specific Gravity, Urine 10/20/2013 1.014  1.005 - 1.030 Final  . pH 10/20/2013 7.0  5.0 - 8.0 Final  . Glucose, UA 10/20/2013 NEGATIVE  NEGATIVE mg/dL Final  . Hgb urine dipstick 10/20/2013 NEGATIVE  NEGATIVE Final  . Bilirubin Urine 10/20/2013 NEGATIVE  NEGATIVE Final  . Ketones, ur 10/20/2013 NEGATIVE  NEGATIVE mg/dL Final  . Protein, ur 10/20/2013 NEGATIVE  NEGATIVE mg/dL Final  . Urobilinogen, UA 10/20/2013 0.2  0.0 - 1.0 mg/dL Final  . Nitrite 10/20/2013 NEGATIVE  NEGATIVE Final  . Leukocytes, UA 10/20/2013 NEGATIVE  NEGATIVE Final   MICROSCOPIC NOT DONE ON URINES WITH NEGATIVE PROTEIN, BLOOD, LEUKOCYTES, NITRITE, OR GLUCOSE <1000 mg/dL.  Marland Kitchen Hemoglobin A1C 10/21/2013 6.1* <5.7 % Final   Comment: (NOTE)                                                                                                                         According to the ADA Clinical Practice Recommendations for 2011, when                          HbA1c is used as a screening test:                           >=6.5%   Diagnostic of Diabetes Mellitus                                    (if abnormal result is confirmed)                          5.7-6.4%   Increased risk of developing Diabetes Mellitus                          References:Diagnosis and Classification of Diabetes Mellitus,Diabetes                          [id number](BMWUX 1):S62-S69 and Standards of Medical Care in                                  Diabetes - 2011,Diabetes [id number],[id number] (Suppl 1):S11-S61.  . Mean Plasma Glucose 10/21/2013 128* <117 mg/dL Final   Performed at [facility]  . Cholesterol 10/21/2013 138  0 - 200 mg/dL Final  . Triglycerides 10/21/2013 80  <150 mg/dL Final  . HDL 10/21/2013 44  >39 mg/dL Final  . Total CHOL/HDL  Ratio 10/21/2013 3.1   Final  . VLDL 10/21/2013 16  0 - 40  mg/dL Final  . LDL Cholesterol 10/21/2013 78  0 - 99 mg/dL Final   Comment:                                 Total Cholesterol/HDL:CHD Risk                          Coronary Heart Disease Risk Table                                              Men   Women                           1/2 Average Risk   3.4   3.3                           Average Risk       5.0   4.4                           2 X Average Risk   9.6   7.1                           3 X Average Risk  23.4   11.0                                                          Use the calculated Patient Ratio                          above and the CHD Risk Table                          to determine the patient's CHD Risk.                                                          ATP III CLASSIFICATION (LDL):                           <100     mg/dL   Optimal                           100-129  mg/dL   Near or Above                                             Optimal  130-159  mg/dL   Borderline                           160-189  mg/dL   High                           >190     mg/dL   Very High  . Glucose-Capillary 10/20/2013 153* 70 - 99 mg/dL Final  . Comment 1 10/20/2013 Notify RN   Final  . Comment 2 10/20/2013 Documented in Chart   Final  . Glucose-Capillary 10/21/2013 111* 70 - 99 mg/dL Final  . Comment 1 10/21/2013 Notify RN   Final  . Comment 2 10/21/2013 Documented in Chart   Final  . Glucose-Capillary 10/21/2013 106* 70 - 99 mg/dL Final  . Comment 1 10/21/2013 Documented in Chart   Final  . Glucose-Capillary 10/21/2013 86  70 - 99 mg/dL Final  . Comment 1 10/21/2013 Documented in Chart   Final  . Glucose-Capillary 10/21/2013 146* 70 - 99 mg/dL Final  . Glucose-Capillary 10/22/2013 99  70 - 99 mg/dL Final  . Comment 1 10/22/2013 Notify RN   Final  . Comment 2 10/22/2013 Documented in Chart   Final  . Glucose-Capillary 10/22/2013 124* 70 -  99 mg/dL Final  . Glucose-Capillary 10/22/2013 105* 70 - 99 mg/dL Final  . Glucose-Capillary 10/22/2013 96  70 - 99 mg/dL Final  . Comment 1 10/22/2013 Notify RN   Final  . Comment 2 10/22/2013 Documented in Chart   Final  . Glucose-Capillary 10/23/2013 91  70 - 99 mg/dL Final  . Comment 1 10/23/2013 Notify RN   Final  . Comment 2 10/23/2013 Documented in Chart   Final  . Glucose-Capillary 10/23/2013 100* 70 - 99 mg/dL Final  . Glucose-Capillary 10/23/2013 94  70 - 99 mg/dL Final  . Comment 1 10/23/2013 Notify RN   Final  . Glucose-Capillary 10/23/2013 124* 70 - 99 mg/dL Final  . Glucose-Capillary 10/24/2013 94  70 - 99 mg/dL Final  . Comment 1 10/24/2013 Documented in Chart   Final  . Comment 2 10/24/2013 Notify RN   Final  . Glucose-Capillary 10/24/2013 131* 70 - 99 mg/dL Final  . Glucose-Capillary 10/24/2013 71  70 - 99 mg/dL Final  . Glucose-Capillary 10/24/2013 99  70 - 99 mg/dL Final  . Glucose-Capillary 10/25/2013 103* 70 - 99 mg/dL Final  . Glucose-Capillary 10/25/2013 113* 70 - 99 mg/dL Final  . Comment 1 10/25/2013 Documented in Chart   Final  . Comment 2 10/25/2013 Notify RN   Final  . Glucose-Capillary 10/25/2013 105* 70 - 99 mg/dL Final  . Comment 1 10/25/2013 Documented in Chart   Final  . Comment 2 10/25/2013 Notify RN   Final   10/29/13 CBC: WBC 7.8, hemoglobin 13.3, hematocrit 40.8, MCV 96.5, platelet 139  CMP: Sodium 139, potassium 4.5, glucose 108, BUN 42, creatinine 1.4, calcium 8.2, liver enzymes normal  10/30/13 chest x-ray: Patchy interstitial changes in the left lower lung most consistent with subsegmental atelectasis. Osteopenia. Heart size borderline. Bilateral apical scarring.   10/31/13 ultrasound of the right pubic area: Hernia in the right pubic area   Assessment/Plan  1. CVA (cerebral infarction) Recovering. Patient is engaged with physical therapy and occupational therapy. He still requires the assistance of one or 2 persons to walk safely. This  likely remain while he is taking Depakote. He was on this medication prior to  the hospitalization 10/20/13. It is possible that he was being given this drug for behavioral issues. There is no history of seizure disorder. He remains on Plavix to try to prevent future strokes.  2. Dementia Patient has been labeled as Alzheimer's and is on appropriate medication for this. Because of the multiple strokes, there is likely to be a vascular component to his dementia.  3. BPH (benign prostatic hyperplasia) Patient now having obstructive symptoms.  4. Essential hypertension Controlled  5. UTI (lower urinary tract infection) E. coli. Was treated with Rocephin. Also has ciprofloxacin to finish the treatment.  6. Hemiparesis, right Patient is able stand and walk. His right foot deviates to the right. He is weak her right side in both legs and right arm.  7. Aphasia due to recent cerebral infarction Patient clearly is having some trouble with word finding and expressing himself.  8. Hypertrophy of prostate with urinary obstruction and other lower urinary tract symptoms (LUTS) Scheduled to see Dr. Junious Silk. Leaving him on both the Flomax and oxybutynin for the time being. It would be reasonable, in my opinion, to stop the oxybutynin. We will like for the opinion of Dr. Junious Silk.

## 2013-11-07 ENCOUNTER — Ambulatory Visit: Payer: Medicare PPO

## 2013-11-07 ENCOUNTER — Encounter: Payer: Self-pay | Admitting: Internal Medicine

## 2013-11-07 DIAGNOSIS — G8191 Hemiplegia, unspecified affecting right dominant side: Secondary | ICD-10-CM

## 2013-11-07 DIAGNOSIS — I69320 Aphasia following cerebral infarction: Secondary | ICD-10-CM | POA: Insufficient documentation

## 2013-11-07 DIAGNOSIS — N138 Other obstructive and reflux uropathy: Secondary | ICD-10-CM

## 2013-11-07 DIAGNOSIS — N401 Enlarged prostate with lower urinary tract symptoms: Secondary | ICD-10-CM | POA: Insufficient documentation

## 2013-11-07 HISTORY — DX: Aphasia following cerebral infarction: I69.320

## 2013-11-07 HISTORY — DX: Other obstructive and reflux uropathy: N13.8

## 2013-11-07 HISTORY — DX: Hemiplegia, unspecified affecting right dominant side: G81.91

## 2013-11-08 LAB — CULTURE, BLOOD (ROUTINE X 2)
CULTURE: NO GROWTH
Culture: NO GROWTH

## 2013-11-09 ENCOUNTER — Ambulatory Visit (INDEPENDENT_AMBULATORY_CARE_PROVIDER_SITE_OTHER): Payer: Medicare PPO | Admitting: *Deleted

## 2013-11-09 DIAGNOSIS — I6350 Cerebral infarction due to unspecified occlusion or stenosis of unspecified cerebral artery: Secondary | ICD-10-CM

## 2013-11-09 LAB — MDC_IDC_ENUM_SESS_TYPE_INCLINIC

## 2013-11-09 NOTE — Progress Notes (Signed)
ILR wound check.  Steri strips removed.  Wound well healed.

## 2013-11-16 ENCOUNTER — Encounter: Payer: Self-pay | Admitting: Internal Medicine

## 2013-11-16 ENCOUNTER — Non-Acute Institutional Stay (SKILLED_NURSING_FACILITY): Payer: Medicare PPO | Admitting: Adult Health

## 2013-11-16 ENCOUNTER — Encounter: Payer: Self-pay | Admitting: Adult Health

## 2013-11-16 DIAGNOSIS — G8191 Hemiplegia, unspecified affecting right dominant side: Secondary | ICD-10-CM

## 2013-11-16 DIAGNOSIS — N400 Enlarged prostate without lower urinary tract symptoms: Secondary | ICD-10-CM

## 2013-11-16 DIAGNOSIS — F329 Major depressive disorder, single episode, unspecified: Secondary | ICD-10-CM

## 2013-11-16 DIAGNOSIS — F0390 Unspecified dementia without behavioral disturbance: Secondary | ICD-10-CM

## 2013-11-16 DIAGNOSIS — I10 Essential (primary) hypertension: Secondary | ICD-10-CM

## 2013-11-16 DIAGNOSIS — G8190 Hemiplegia, unspecified affecting unspecified side: Secondary | ICD-10-CM

## 2013-11-16 DIAGNOSIS — F32A Depression, unspecified: Secondary | ICD-10-CM

## 2013-11-16 DIAGNOSIS — I6350 Cerebral infarction due to unspecified occlusion or stenosis of unspecified cerebral artery: Secondary | ICD-10-CM

## 2013-11-16 DIAGNOSIS — F3289 Other specified depressive episodes: Secondary | ICD-10-CM

## 2013-11-16 DIAGNOSIS — I639 Cerebral infarction, unspecified: Secondary | ICD-10-CM

## 2013-11-16 DIAGNOSIS — E785 Hyperlipidemia, unspecified: Secondary | ICD-10-CM

## 2013-11-16 NOTE — Progress Notes (Signed)
Patient ID: Trevor Lutz, male   DOB: 02/13/1922, 80 y.o.   MRN: [id number]         PROGRESS NOTE  DATE: 11/16/13  FACILITY: Nursing Home Location: [facility]  LEVEL OF CARE: SNF (31)  Acute Visit  CHIEF COMPLAINT:  Discharge Notes  HISTORY OF PRESENT ILLNESS: This is an 80 year old male who is for discharge home with Home health PT, OT and Nursing. DME: Rolling walker. He has has been admitted to [facility] on 10/25/13 from [facility] with principal diagnosis of CVA (cerebral infarction). Patient was admitted to this facility for short-term rehabilitation after the patient's recent hospitalization.  Patient has completed SNF rehabilitation and therapy has cleared the patient for discharge.   REASSESSMENT OF ONGOING PROBLEM(S):  HTN: Pt 's HTN remains stable.  Denies CP, sob, DOE, pedal edema, headaches, dizziness or visual disturbances.  No complications from the medications currently being used.  Last BP : 132/82  BPH: The patient's BPH remains stable. Patient denies urinary hesitancy or dribbling. No complications reported from the current medications being used.  DEPRESSION: The depression remains stable. Patient denies ongoing feelings of sadness, insomnia, anedhonia or lack of appetite. No complications reported from the medications currently being used. Staff do not report behavioral problems.   PAST MEDICAL HISTORY : Reviewed.  No changes/see problem list  CURRENT MEDICATIONS: Reviewed per MAR/see medication list  REVIEW OF SYSTEMS:  GENERAL: no change in appetite, no fatigue, no weight changes, no fever, chills or weakness RESPIRATORY: no cough, SOB, DOE, wheezing, hemoptysis CARDIAC: no chest pain, edema or palpitations GI: no abdominal pain, diarrhea, constipation, heart burn, nausea or vomiting NEUROLOGICAL: +hemiparesis  PHYSICAL EXAMINATION  GENERAL: no acute distress, normal body habitus NECK: supple, trachea midline, no neck  masses, no thyroid tenderness, no thyromegaly LYMPHATICS: no LAN in the neck, no supraclavicular LAN RESPIRATORY: breathing is even & unlabored, BS CTAB CARDIAC: RRR, no murmur,no extra heart sounds, no edema GI: abdomen soft, normal BS, no masses, no tenderness, no hepatomegaly, no splenomegaly EXTREMITIES: Right sided weakness; ambulates with walker PSYCHIATRIC: the patient is alert & oriented to person, affect & behavior appropriate  LABS/RADIOLOGY: Labs reviewed: Basic Metabolic Panel:  Recent Labs  10/20/13 1447 10/20/13 1456 11/02/13 1010 11/02/13 1632 11/03/13 0440  NA 139 141 139  --  138  K 5.0 4.5 4.8  --  4.6  CL 102 101 104  --  101  CO2 25  --  26  --  26  GLUCOSE 98 103* 95  --  94  BUN 19 20 21   --  24*  CREATININE 1.05 1.30 0.93 1.04 0.94  CALCIUM 8.4  --  8.5  --  8.7  MG  --   --   --  2.1  --   PHOS  --   --   --  2.8  --    Liver Function Tests:  Recent Labs  10/20/13 1447 11/02/13 1010  AST 31 28  ALT 19 38  ALKPHOS 51 64  BILITOT 0.3 0.4  PROT 6.3 5.7*  ALBUMIN 3.2* 2.4*   CBC:  Recent Labs  10/20/13 1447  11/02/13 1010 11/02/13 1632 11/03/13 0440  WBC 5.1  --  7.4 9.3 7.5  NEUTROABS 2.4  --  5.2  --   --   HGB 14.7  < > 13.1 13.3 13.9  HCT 43.7  < > 38.5* 39.7 39.7  MCV 95.2  --  94.4 94.3 93.2  PLT 126*  --  188 203 211  < > = values in this interval not displayed.  Lipid Panel:  Recent Labs  10/21/13 0515  HDL 44   CBG:  Recent Labs  10/25/13 0655 10/25/13 1149 10/25/13 1857  GLUCAP 103* 113* 105*    ASSESSMENT/PLAN:  CVA (cerebral infarct) with Right Hemiparesis - stable; for Home health PT, OT and Nursing Dementia - stable; continue Depakote, Namenda, and Razadyne Hypertension - well controlled; continue Norvasc BPH - stable; continue Flomax Hyperlipidemia - continue Zocor Depression - stable; continue Celexa   I have filled out patient's discharge paperwork and written prescriptions.  Patient will  receive home health PT, OT and Nursing.  DME provided: Rolling walker  Total discharge time: Greater than 30 minutes Discharge time involved coordination of the discharge process with Education officer, museum, nursing staff and therapy department. Medical justification for home health services/DME verified.   CPT CODE: 70177  Seth Bake - NP [facility] [phone]

## 2013-11-19 ENCOUNTER — Encounter: Payer: Self-pay | Admitting: *Deleted

## 2013-11-22 ENCOUNTER — Encounter: Payer: Self-pay | Admitting: Internal Medicine

## 2013-11-22 DIAGNOSIS — I69919 Unspecified symptoms and signs involving cognitive functions following unspecified cerebrovascular disease: Secondary | ICD-10-CM

## 2013-11-22 DIAGNOSIS — I69920 Aphasia following unspecified cerebrovascular disease: Secondary | ICD-10-CM

## 2013-11-22 DIAGNOSIS — G309 Alzheimer's disease, unspecified: Secondary | ICD-10-CM

## 2013-11-22 DIAGNOSIS — I69959 Hemiplegia and hemiparesis following unspecified cerebrovascular disease affecting unspecified side: Secondary | ICD-10-CM

## 2013-11-22 DIAGNOSIS — F0280 Dementia in other diseases classified elsewhere without behavioral disturbance: Secondary | ICD-10-CM

## 2013-11-27 ENCOUNTER — Ambulatory Visit: Payer: Self-pay | Admitting: Neurology

## 2013-11-28 ENCOUNTER — Ambulatory Visit: Payer: Self-pay | Admitting: Neurology

## 2013-12-17 ENCOUNTER — Ambulatory Visit (INDEPENDENT_AMBULATORY_CARE_PROVIDER_SITE_OTHER): Payer: Medicare PPO | Admitting: *Deleted

## 2013-12-17 DIAGNOSIS — I639 Cerebral infarction, unspecified: Secondary | ICD-10-CM

## 2013-12-17 DIAGNOSIS — I6350 Cerebral infarction due to unspecified occlusion or stenosis of unspecified cerebral artery: Secondary | ICD-10-CM

## 2013-12-18 NOTE — Progress Notes (Signed)
Loop recorder 

## 2014-01-11 ENCOUNTER — Ambulatory Visit: Payer: Self-pay | Admitting: Neurology

## 2014-01-15 ENCOUNTER — Ambulatory Visit (INDEPENDENT_AMBULATORY_CARE_PROVIDER_SITE_OTHER): Payer: Medicare PPO | Admitting: *Deleted

## 2014-01-15 DIAGNOSIS — I639 Cerebral infarction, unspecified: Secondary | ICD-10-CM

## 2014-01-15 DIAGNOSIS — I6350 Cerebral infarction due to unspecified occlusion or stenosis of unspecified cerebral artery: Secondary | ICD-10-CM

## 2014-01-15 LAB — MDC_IDC_ENUM_SESS_TYPE_REMOTE

## 2014-01-16 LAB — MDC_IDC_ENUM_SESS_TYPE_REMOTE

## 2014-01-17 NOTE — Progress Notes (Signed)
Loop recorder 

## 2014-02-07 ENCOUNTER — Encounter: Payer: Self-pay | Admitting: Internal Medicine

## 2014-02-13 ENCOUNTER — Ambulatory Visit (INDEPENDENT_AMBULATORY_CARE_PROVIDER_SITE_OTHER): Payer: Medicare PPO | Admitting: *Deleted

## 2014-02-13 DIAGNOSIS — I639 Cerebral infarction, unspecified: Secondary | ICD-10-CM

## 2014-02-13 DIAGNOSIS — I6350 Cerebral infarction due to unspecified occlusion or stenosis of unspecified cerebral artery: Secondary | ICD-10-CM

## 2014-02-13 LAB — MDC_IDC_ENUM_SESS_TYPE_REMOTE

## 2014-02-20 NOTE — Progress Notes (Signed)
Loop recorder 

## 2014-02-25 LAB — MDC_IDC_ENUM_SESS_TYPE_REMOTE

## 2014-03-13 ENCOUNTER — Ambulatory Visit (INDEPENDENT_AMBULATORY_CARE_PROVIDER_SITE_OTHER): Payer: Medicare PPO | Admitting: *Deleted

## 2014-03-13 DIAGNOSIS — I6350 Cerebral infarction due to unspecified occlusion or stenosis of unspecified cerebral artery: Secondary | ICD-10-CM

## 2014-03-13 DIAGNOSIS — I639 Cerebral infarction, unspecified: Secondary | ICD-10-CM

## 2014-03-20 ENCOUNTER — Encounter (INDEPENDENT_AMBULATORY_CARE_PROVIDER_SITE_OTHER): Payer: Self-pay

## 2014-03-20 ENCOUNTER — Ambulatory Visit (INDEPENDENT_AMBULATORY_CARE_PROVIDER_SITE_OTHER): Payer: Medicare PPO | Admitting: Neurology

## 2014-03-20 ENCOUNTER — Encounter: Payer: Self-pay | Admitting: Neurology

## 2014-03-20 VITALS — BP 128/70 | HR 66 | Resp 16 | Wt 171.0 lb

## 2014-03-20 DIAGNOSIS — F0391 Unspecified dementia with behavioral disturbance: Secondary | ICD-10-CM

## 2014-03-20 DIAGNOSIS — F03918 Unspecified dementia, unspecified severity, with other behavioral disturbance: Secondary | ICD-10-CM | POA: Insufficient documentation

## 2014-03-20 NOTE — Progress Notes (Signed)
   Provider:    , M D  Referring Provider: [name], [name], MD Primary Care Physician:  [name],[name], MD  Chief Complaint  Patient presents with  . Tremors    rm 10    HPI:  Trevor Lutz is a 80 y.o. male , who is seen here as a referral from Dr. [name] , and used to be followed by Dr Love .   He was last seen in 2013 and described as progressively demented with personality and behavior changes for over a decade. One Stroke in 2010 and one 2 years ago while at a wedding, treated at [facility], where his son is a neuro otologist. He has been confused and gives often instructions to his group of full time caretakers that are not sensible. He is unaware of his memory loss, and his MMSE in august 2013 was 22 /30 , now 17/30. Short term memory has declined drastically.  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 The patient confirmed he had met Joseph Stalin- personally.  He has been stable overall , a slow dementia progression, and he has many resources of stimulation and physical care surrounding him.       Review of Systems: Out of a complete 14 system review, the patient complains of only the following symptoms, and all other reviewed systems are negative. Dementia, repeatedly asking the same question. His caretakers report he is not drinking enough.   History   Social History  . Marital Status: Married    Spouse Name: N/A    Number of Children: N/A  . Years of Education: N/A   Occupational History  . Not on file.   Social History Main Topics  . Smoking status: Former Smoker    Types: Cigarettes  . Smokeless tobacco: Never Used     Comment: QUIT SMOKING MANY YEARS AGO  . Alcohol Use: 1.2 oz/week    2 Glasses of wine per week     Comment: 2 glasses a week  . Drug Use: No  . Sexual Activity: No   Other Topics Concern  . Not on file   Social History Narrative  . No narrative on file    Family  History  Problem Relation Age of Onset  . Heart disease Father   . Heart disease Mother     Past Medical History  Diagnosis Date  . Hypertension   . BPH (benign prostatic hyperplasia)     HAS INDWELLING FOLEY CATHETER  . Alzheimer disease   . Hyperlipidemia   . Erectile dysfunction   . Mild depression   . Spinal stenosis   . CVA (cerebral vascular accident) 2010    Left basal ganglia  . Acute CVA (cerebrovascular accident) 10/25/13    Left frontal lobe, left parietal lobe, left occipital lobe  . Separation of left acromioclavicular joint, type 3 2003  . Hemiparesis, right 11/07/2013  . Aphasia due to recent cerebral infarction 11/07/2013  . Hypertrophy of prostate with urinary obstruction and other lower urinary tract symptoms (LUTS) 11/07/2013  . Right inguinal hernia 09/26/2012    Past Surgical History  Procedure Laterality Date  . Kidney surgery      right kidney removed due  to traumatic injury  . Tonsilectomy, adenoidectomy, bilateral myringotomy and tubes    . Hernia repair Left 09/24/1996    Indirect, Dr Newman surgeon  . Transurethral resection of prostate N/A 11/13/2012    Procedure:   BI POLAR TRANSURETHRAL RESECTION OF THE PROSTATE (TURP);  Surgeon: Matthew Ramsey Eskridge, MD;  Location: [facility];  Service: Urology;  Laterality: N/A;  . Tee without cardioversion N/A 10/23/2013    Procedure: TRANSESOPHAGEAL ECHOCARDIOGRAM (TEE);  Surgeon: Philip J Nahser, MD;  Location: [facility];  Service: Cardiovascular;  Laterality: N/A;  . Loop recorder implant  10-24-2013    MDT LinQ implanted by Dr Klein for cryptogenic stroke  . Orif acromioclavicular joint Left 2003    Current Outpatient Prescriptions  Medication Sig Dispense Refill  . amLODipine (NORVASC) 5 MG tablet Take 5 mg by mouth daily before breakfast.       . cholecalciferol (VITAMIN D) 1000 UNITS tablet Take 1,000 Units by mouth every morning.       . citalopram (CELEXA) 10 MG tablet Take 10 mg by mouth every morning.        . clopidogrel (PLAVIX) 75 MG tablet Take 75 mg by mouth daily with breakfast.      . divalproex (DEPAKOTE) 250 MG DR tablet Take 250 mg by mouth 2 (two) times daily.       . docusate sodium (COLACE) 100 MG capsule Take 100 mg by mouth 2 (two) times daily.      . galantamine (RAZADYNE) 12 MG tablet Take 12 mg by mouth 2 (two) times daily.      . guaiFENesin-dextromethorphan (ROBITUSSIN DM) 100-10 MG/5ML syrup Take 10 mLs by mouth every 4 (four) hours as needed for cough.      . ibuprofen (ADVIL,MOTRIN) 200 MG tablet Take 400 mg by mouth every 8 (eight) hours as needed for mild pain.      . l-methylfolate-b2-b6-b12 (CEREFOLIN) 11-12-48-5 MG TABS Take 1 tablet by mouth every morning.       . memantine (NAMENDA) 10 MG tablet Take 10 mg by mouth 2 (two) times daily.      . Multiple Vitamin (MULTIVITAMIN) tablet Take 1 tablet by mouth every morning.       . nitroGLYCERIN (NITROSTAT) 0.4 MG SL tablet Place 0.4 mg under the tongue every 5 (five) minutes as needed for chest pain. Administer 3 doses sl for chest pain, notify MD/NP if ineffective.      . oxybutynin (DITROPAN) 5 MG tablet Take 5 mg by mouth 2 (two) times daily as needed for bladder spasms.      . polyethylene glycol (MIRALAX / GLYCOLAX) packet Take 17 g by mouth daily. For constipation      . simvastatin (ZOCOR) 20 MG tablet Take 20 mg by mouth every evening.      . tamsulosin (FLOMAX) 0.4 MG CAPS Take 0.4 mg by mouth daily after breakfast.       . vitamin B-12 (CYANOCOBALAMIN) 100 MCG tablet Take 100 mcg by mouth every morning.       . vitamin C (ASCORBIC ACID) 500 MG tablet Take 500 mg by mouth daily.       No current facility-administered medications for this visit.    Allergies as of 03/20/2014  . (No Known Allergies)    Vitals: BP 128/70  Pulse 66  Resp 16  Wt 171 lb (77.565 kg) Last Weight:  Wt Readings from Last 1 Encounters:  03/20/14 171 lb (77.565 kg)   Last Height:   Ht Readings from Last 1 Encounters:    11/16/13 5' 7" (1.702 m)    Physical exam:  General: The patient is awake, alert and appears not in acute distress. The patient is well groomed. Head: Normocephalic, atraumatic.   Neck is supple. Mallampati 3, neck circumference: 16  Cardiovascular:  Regular rate and rhythm , without  murmurs or carotid bruit, and without distended neck veins. Respiratory: Lungs are clear to auscultation. Skin:  Without evidence of edema, or rash  Neurologic exam : The patient is awake and alert, disoriented to place and time.  Memory severely impaired -as are attention span & concentration ability. Speech is fluent with" small talk " content- cannot answer questions and  needs repeated instructions.  dysarthria, dysphonia or aphasia. Mood and affect are appropriate.  Cranial nerves: Pupils are equal and briskly reactive to light. Extraocular movements  in vertical and horizontal planes intact and with end point nystagmus. In the horizontal plane, most evident to the right.  Visual fields by finger perimetry are intact. Hearing to finger rub intact. Facial sensation intact to fine touch. Facial motor strength is symmetric and tongue and uvula move midline. Tongue protrusion into either cheek is normal. Shoulder shrug is normal.   Motor exam:  The patient has good symmetric strength in upper and lower extremities, there is some gegenhalten, his tone is increased but it is not cog-wheeling. Sensory:  Fine touch, pinprick and vibration were tested in all extremities. Proprioception was normal.  Coordination: Rapid alternating movements in the fingers/hands were slowed. Finger-to-nose maneuver : dysmetria, no  tremor.  Gait and station: Patient walks without assistive device , with assistance able  to climb up to the exam table.  Strength within normal limits. Stance is stable and normal.   Deep tendon reflexes: in the upper and lower extremities are brisk . Babinski maneuver response is   downgoing.   Assessment:  After physical and neurologic examination, review of laboratory studies, imaging, neurophysiology testing and pre-existing records, assessment is that of :  Dementia with hallucinations, but not parkinsonian, not of Lewy Body Type ,  with vascular contribution. He has declined to a MSSE of 17-30 points.     Plan:  Treatment plan and additional workup : Continue current meds, encourage hydration.  except DITROPAN as an anti-cholinerig medication. Rv in 12 month        MD 03/20/2014              

## 2014-03-20 NOTE — Patient Instructions (Addendum)

## 2014-03-21 ENCOUNTER — Ambulatory Visit: Payer: Self-pay | Admitting: Neurology

## 2014-03-22 NOTE — Progress Notes (Signed)
Loop recorder 

## 2014-03-29 ENCOUNTER — Encounter: Payer: Self-pay | Admitting: Internal Medicine

## 2014-03-29 LAB — MDC_IDC_ENUM_SESS_TYPE_REMOTE

## 2014-04-02 ENCOUNTER — Other Ambulatory Visit: Payer: Self-pay | Admitting: Nurse Practitioner

## 2014-04-02 ENCOUNTER — Encounter: Payer: Self-pay | Admitting: Internal Medicine

## 2014-04-02 ENCOUNTER — Ambulatory Visit
Admission: RE | Admit: 2014-04-02 | Discharge: 2014-04-02 | Disposition: A | Discharge status: Home / Self Care | Payer: Medicare Other | Source: Ambulatory Visit | Attending: Nurse Practitioner | Admitting: Nurse Practitioner

## 2014-04-02 DIAGNOSIS — J40 Bronchitis, not specified as acute or chronic: Secondary | ICD-10-CM

## 2014-04-15 ENCOUNTER — Ambulatory Visit (INDEPENDENT_AMBULATORY_CARE_PROVIDER_SITE_OTHER): Payer: Medicare PPO | Admitting: *Deleted

## 2014-04-15 DIAGNOSIS — I639 Cerebral infarction, unspecified: Secondary | ICD-10-CM

## 2014-04-17 NOTE — Progress Notes (Signed)
Loop recorder 

## 2014-04-23 ENCOUNTER — Encounter: Payer: Self-pay | Admitting: Internal Medicine

## 2014-04-28 LAB — MDC_IDC_ENUM_SESS_TYPE_REMOTE

## 2014-05-15 ENCOUNTER — Ambulatory Visit (INDEPENDENT_AMBULATORY_CARE_PROVIDER_SITE_OTHER): Payer: Medicare PPO | Admitting: *Deleted

## 2014-05-15 DIAGNOSIS — I639 Cerebral infarction, unspecified: Secondary | ICD-10-CM

## 2014-05-22 NOTE — Progress Notes (Signed)
Loop recorder 

## 2014-05-23 ENCOUNTER — Encounter (HOSPITAL_COMMUNITY): Payer: Self-pay | Admitting: Internal Medicine

## 2014-06-11 LAB — MDC_IDC_ENUM_SESS_TYPE_REMOTE

## 2014-06-13 ENCOUNTER — Encounter: Payer: Self-pay | Admitting: Internal Medicine

## 2014-06-13 ENCOUNTER — Ambulatory Visit (INDEPENDENT_AMBULATORY_CARE_PROVIDER_SITE_OTHER): Payer: Medicare PPO | Admitting: *Deleted

## 2014-06-13 DIAGNOSIS — I10 Essential (primary) hypertension: Secondary | ICD-10-CM

## 2014-06-13 DIAGNOSIS — I639 Cerebral infarction, unspecified: Secondary | ICD-10-CM

## 2014-06-21 NOTE — Progress Notes (Signed)
Loop recorder 

## 2014-07-01 LAB — MDC_IDC_ENUM_SESS_TYPE_REMOTE

## 2014-07-11 ENCOUNTER — Encounter: Payer: Self-pay | Admitting: Internal Medicine

## 2014-07-15 ENCOUNTER — Ambulatory Visit (INDEPENDENT_AMBULATORY_CARE_PROVIDER_SITE_OTHER): Payer: Medicare PPO | Admitting: *Deleted

## 2014-07-15 DIAGNOSIS — I639 Cerebral infarction, unspecified: Secondary | ICD-10-CM

## 2014-07-15 LAB — MDC_IDC_ENUM_SESS_TYPE_REMOTE

## 2014-07-16 NOTE — Progress Notes (Signed)
Loop recorder 

## 2014-07-23 ENCOUNTER — Encounter: Payer: Self-pay | Admitting: Internal Medicine

## 2014-08-13 ENCOUNTER — Ambulatory Visit (INDEPENDENT_AMBULATORY_CARE_PROVIDER_SITE_OTHER): Payer: Medicare PPO | Admitting: *Deleted

## 2014-08-13 DIAGNOSIS — I639 Cerebral infarction, unspecified: Secondary | ICD-10-CM

## 2014-08-14 LAB — MDC_IDC_ENUM_SESS_TYPE_REMOTE

## 2014-08-15 NOTE — Progress Notes (Signed)
Loop recorder 

## 2014-08-27 ENCOUNTER — Encounter: Payer: Self-pay | Admitting: Internal Medicine

## 2014-09-12 ENCOUNTER — Ambulatory Visit (INDEPENDENT_AMBULATORY_CARE_PROVIDER_SITE_OTHER): Payer: Medicare PPO | Admitting: *Deleted

## 2014-09-12 DIAGNOSIS — I639 Cerebral infarction, unspecified: Secondary | ICD-10-CM

## 2014-09-12 LAB — MDC_IDC_ENUM_SESS_TYPE_REMOTE

## 2014-09-12 NOTE — Progress Notes (Signed)
Loop recorder 

## 2014-09-16 ENCOUNTER — Encounter: Payer: Self-pay | Admitting: Internal Medicine

## 2014-10-11 ENCOUNTER — Ambulatory Visit (INDEPENDENT_AMBULATORY_CARE_PROVIDER_SITE_OTHER): Payer: Medicare PPO | Admitting: *Deleted

## 2014-10-11 DIAGNOSIS — I639 Cerebral infarction, unspecified: Secondary | ICD-10-CM

## 2014-10-16 NOTE — Progress Notes (Signed)
Loop recorder 

## 2014-10-31 LAB — CUP PACEART REMOTE DEVICE CHECK: MDC IDC SESS DTM: 20160519112507

## 2014-11-06 ENCOUNTER — Encounter: Payer: Self-pay | Admitting: Internal Medicine

## 2014-11-12 ENCOUNTER — Ambulatory Visit (INDEPENDENT_AMBULATORY_CARE_PROVIDER_SITE_OTHER): Payer: Medicare PPO | Admitting: *Deleted

## 2014-11-12 DIAGNOSIS — I639 Cerebral infarction, unspecified: Secondary | ICD-10-CM | POA: Diagnosis not present

## 2014-11-15 NOTE — Progress Notes (Signed)
Loop recorder 

## 2014-11-19 LAB — CUP PACEART REMOTE DEVICE CHECK: Date Time Interrogation Session: 20160607104853

## 2014-11-28 ENCOUNTER — Encounter: Payer: Self-pay | Admitting: Internal Medicine

## 2014-11-28 ENCOUNTER — Ambulatory Visit (INDEPENDENT_AMBULATORY_CARE_PROVIDER_SITE_OTHER): Payer: Medicare PPO | Admitting: Internal Medicine

## 2014-11-28 VITALS — BP 110/60 | HR 57 | Ht 68.0 in | Wt 178.0 lb

## 2014-11-28 DIAGNOSIS — I639 Cerebral infarction, unspecified: Secondary | ICD-10-CM | POA: Diagnosis not present

## 2014-11-28 LAB — CUP PACEART INCLINIC DEVICE CHECK: Date Time Interrogation Session: 20160616120355

## 2014-11-28 NOTE — Progress Notes (Signed)
Patient Care Team: Lavone Orn, MD as PCP - General (Internal Medicine)   HPI  Trevor Lutz is a 81 y.o. male Seen in follow-up for loop recorder implanted for cryptogenic stroke.  He denies symptoms but is unaware of the current president candidates.  Past Medical History  Diagnosis Date  . Hypertension   . BPH (benign prostatic hyperplasia)     HAS INDWELLING FOLEY CATHETER  . Alzheimer disease   . Hyperlipidemia   . Erectile dysfunction   . Mild depression   . Spinal stenosis   . CVA (cerebral vascular accident) 2010    Left basal ganglia  . Acute CVA (cerebrovascular accident) 10/25/13    Left frontal lobe, left parietal lobe, left occipital lobe  . Separation of left acromioclavicular joint, type 3 2003  . Hemiparesis, right 11/07/2013  . Aphasia due to recent cerebral infarction 11/07/2013  . Hypertrophy of prostate with urinary obstruction and other lower urinary tract symptoms (LUTS) 11/07/2013  . Right inguinal hernia 09/26/2012    Past Surgical History  Procedure Laterality Date  . Kidney surgery      right kidney removed due  to traumatic injury  . Tonsilectomy, adenoidectomy, bilateral myringotomy and tubes    . Hernia repair Left 09/24/1996    Indirect, Dr Lucia Gaskins surgeon  . Transurethral resection of prostate N/A 11/13/2012    Procedure: BI POLAR TRANSURETHRAL RESECTION OF THE PROSTATE (TURP);  Surgeon: Fredricka Bonine, MD;  Location: [facility] ORS;  Service: Urology;  Laterality: N/A;  . Tee without cardioversion N/A 10/23/2013    Procedure: TRANSESOPHAGEAL ECHOCARDIOGRAM (TEE);  Surgeon: Thayer Headings, MD;  Location: [facility] ENDOSCOPY;  Service: Cardiovascular;  Laterality: N/A;  . Loop recorder implant  10-24-2013    MDT LinQ implanted by Dr Caryl Comes for cryptogenic stroke  . Orif acromioclavicular joint Left 2003  . Loop recorder implant N/A 10/24/2013    Procedure: LOOP RECORDER IMPLANT;  Surgeon: Deboraha Sprang, MD;  Location: [facility] CATH LAB;  Service:  Cardiovascular;  Laterality: N/A;    Current Outpatient Prescriptions  Medication Sig Dispense Refill  . amLODipine (NORVASC) 5 MG tablet Take 5 mg by mouth daily before breakfast.     . cholecalciferol (VITAMIN D) 1000 UNITS tablet Take 1,000 Units by mouth every morning.     . citalopram (CELEXA) 10 MG tablet Take 10 mg by mouth every morning.     . clopidogrel (PLAVIX) 75 MG tablet Take 75 mg by mouth daily with breakfast.    . divalproex (DEPAKOTE) 250 MG DR tablet Take 250 mg by mouth 2 (two) times daily.     Marland Kitchen galantamine (RAZADYNE) 12 MG tablet Take 12 mg by mouth 2 (two) times daily.    Marland Kitchen guaiFENesin-dextromethorphan (ROBITUSSIN DM) 100-10 MG/5ML syrup Take 10 mLs by mouth every 4 (four) hours as needed for cough.    Marland Kitchen l-methylfolate-b2-b6-b12 (CEREFOLIN) 11-12-48-5 MG TABS Take 1 tablet by mouth every morning.     . memantine (NAMENDA) 10 MG tablet Take 10 mg by mouth 2 (two) times daily.    . Multiple Vitamin (MULTIVITAMIN) tablet Take 1 tablet by mouth every morning.     . nitroGLYCERIN (NITROSTAT) 0.4 MG SL tablet Place 0.4 mg under the tongue every 5 (five) minutes as needed for chest pain. Administer 3 doses sl for chest pain, notify MD/NP if ineffective.    . tamsulosin (FLOMAX) 0.4 MG CAPS Take 0.4 mg by mouth daily after breakfast.     .  vitamin B-12 (CYANOCOBALAMIN) 100 MCG tablet Take 100 mcg by mouth every morning.      No current facility-administered medications for this visit.    No Known Allergies  Review of Systems negative except from HPI and PMH  Physical Exam BP 110/60 mmHg  Pulse 57  Ht 5\' 8"  (1.727 m)  Wt 178 lb (80.74 kg)  BMI 27.07 kg/m2 Well developed and well nourished in no acute distress HENT normal E scleral and icterus clear Neck Supple JVP flat; carotids brisk and full Clear to ausculation  Regular rate and rhythm, no murmurs gallops or rub Soft with active bowel sounds No clubbing cyanosis 2+ Edema Alert but not oriented, grossly normal  motor and sensory function Skin Warm and Dry  ECG demonstrated sinus rhythm at 57 Intervals 17/09/41  Assessment and  Plan  Cryptogenic stroke  Hypertension  Peripheral edema  Implantable loop recorder  The patient's device has demonstrated no intercurrent atrial fibrillation. We continued on Plavix.  There is moderate peripheral edema; this may be related to amlodipine and in the context of a blood pressure of 110, I will discontinue the amlodipine. I will or Dr. Laurann Montana.

## 2014-11-28 NOTE — Patient Instructions (Addendum)
Medication Instructions:  Your physician has recommended you make the following change in your medication:  1) STOP Norvasc  Labwork: NONE  Testing/Procedures: NONE  Follow-Up: Your physician wants you to follow-up in: 12 months with Chanetta Marshall, NP. You will receive a reminder letter in the mail two months in advance. If you don't receive a letter, please call our office to schedule the follow-up appointment.   Any Other Special Instructions Will Be Listed Below (If Applicable).

## 2014-12-11 ENCOUNTER — Encounter: Payer: Self-pay | Admitting: Internal Medicine

## 2014-12-11 ENCOUNTER — Ambulatory Visit (INDEPENDENT_AMBULATORY_CARE_PROVIDER_SITE_OTHER): Payer: Medicare PPO | Admitting: *Deleted

## 2014-12-11 DIAGNOSIS — I639 Cerebral infarction, unspecified: Secondary | ICD-10-CM

## 2014-12-11 LAB — CUP PACEART REMOTE DEVICE CHECK: Date Time Interrogation Session: 20160629161539

## 2014-12-11 NOTE — Progress Notes (Signed)
Loop recorder 

## 2014-12-26 ENCOUNTER — Encounter: Payer: Self-pay | Admitting: Internal Medicine

## 2015-01-02 ENCOUNTER — Inpatient Hospital Stay (HOSPITAL_COMMUNITY)
Admission: EM | Admit: 2015-01-02 | Discharge: 2015-01-04 | DRG: 193 | Disposition: A | Discharge status: Home / Self Care | Payer: Medicare PPO | Attending: Family Medicine | Admitting: Family Medicine

## 2015-01-02 ENCOUNTER — Emergency Department (HOSPITAL_COMMUNITY): Payer: Medicare PPO

## 2015-01-02 ENCOUNTER — Encounter (HOSPITAL_COMMUNITY): Payer: Self-pay | Admitting: Emergency Medicine

## 2015-01-02 DIAGNOSIS — E785 Hyperlipidemia, unspecified: Secondary | ICD-10-CM | POA: Diagnosis present

## 2015-01-02 DIAGNOSIS — J9601 Acute respiratory failure with hypoxia: Secondary | ICD-10-CM | POA: Diagnosis present

## 2015-01-02 DIAGNOSIS — I69320 Aphasia following cerebral infarction: Secondary | ICD-10-CM

## 2015-01-02 DIAGNOSIS — N529 Male erectile dysfunction, unspecified: Secondary | ICD-10-CM | POA: Diagnosis present

## 2015-01-02 DIAGNOSIS — G8191 Hemiplegia, unspecified affecting right dominant side: Secondary | ICD-10-CM

## 2015-01-02 DIAGNOSIS — I10 Essential (primary) hypertension: Secondary | ICD-10-CM | POA: Diagnosis present

## 2015-01-02 DIAGNOSIS — F329 Major depressive disorder, single episode, unspecified: Secondary | ICD-10-CM | POA: Diagnosis present

## 2015-01-02 DIAGNOSIS — J189 Pneumonia, unspecified organism: Secondary | ICD-10-CM | POA: Diagnosis present

## 2015-01-02 DIAGNOSIS — I639 Cerebral infarction, unspecified: Secondary | ICD-10-CM | POA: Diagnosis present

## 2015-01-02 DIAGNOSIS — N179 Acute kidney failure, unspecified: Secondary | ICD-10-CM | POA: Diagnosis present

## 2015-01-02 DIAGNOSIS — Z87891 Personal history of nicotine dependence: Secondary | ICD-10-CM

## 2015-01-02 DIAGNOSIS — F0390 Unspecified dementia without behavioral disturbance: Secondary | ICD-10-CM | POA: Diagnosis present

## 2015-01-02 DIAGNOSIS — J441 Chronic obstructive pulmonary disease with (acute) exacerbation: Secondary | ICD-10-CM | POA: Diagnosis present

## 2015-01-02 DIAGNOSIS — G309 Alzheimer's disease, unspecified: Secondary | ICD-10-CM | POA: Diagnosis present

## 2015-01-02 DIAGNOSIS — Z66 Do not resuscitate: Secondary | ICD-10-CM | POA: Diagnosis present

## 2015-01-02 DIAGNOSIS — Z79899 Other long term (current) drug therapy: Secondary | ICD-10-CM

## 2015-01-02 DIAGNOSIS — Z9079 Acquired absence of other genital organ(s): Secondary | ICD-10-CM | POA: Diagnosis present

## 2015-01-02 DIAGNOSIS — F0280 Dementia in other diseases classified elsewhere without behavioral disturbance: Secondary | ICD-10-CM | POA: Diagnosis present

## 2015-01-02 DIAGNOSIS — I6931 Cognitive deficits following cerebral infarction: Secondary | ICD-10-CM

## 2015-01-02 DIAGNOSIS — G8190 Hemiplegia, unspecified affecting unspecified side: Secondary | ICD-10-CM | POA: Diagnosis present

## 2015-01-02 DIAGNOSIS — F32A Depression, unspecified: Secondary | ICD-10-CM | POA: Diagnosis present

## 2015-01-02 DIAGNOSIS — Z8249 Family history of ischemic heart disease and other diseases of the circulatory system: Secondary | ICD-10-CM | POA: Diagnosis not present

## 2015-01-02 DIAGNOSIS — F0391 Unspecified dementia with behavioral disturbance: Secondary | ICD-10-CM | POA: Diagnosis not present

## 2015-01-02 DIAGNOSIS — Z7902 Long term (current) use of antithrombotics/antiplatelets: Secondary | ICD-10-CM | POA: Diagnosis not present

## 2015-01-02 LAB — URINALYSIS, ROUTINE W REFLEX MICROSCOPIC
Bilirubin Urine: NEGATIVE
Glucose, UA: NEGATIVE mg/dL
KETONES UR: 15 mg/dL — AB
Leukocytes, UA: NEGATIVE
Nitrite: NEGATIVE
Protein, ur: 30 mg/dL — AB
SPECIFIC GRAVITY, URINE: 1.029 (ref 1.005–1.030)
Urobilinogen, UA: 1 mg/dL (ref 0.0–1.0)
pH: 5.5 (ref 5.0–8.0)

## 2015-01-02 LAB — COMPREHENSIVE METABOLIC PANEL
ALT: 16 U/L — AB (ref 17–63)
ANION GAP: 9 (ref 5–15)
AST: 25 U/L (ref 15–41)
Albumin: 4 g/dL (ref 3.5–5.0)
Alkaline Phosphatase: 52 U/L (ref 38–126)
BUN: 20 mg/dL (ref 6–20)
CO2: 26 mmol/L (ref 22–32)
Calcium: 8.7 mg/dL — ABNORMAL LOW (ref 8.9–10.3)
Chloride: 105 mmol/L (ref 101–111)
Creatinine, Ser: 1.32 mg/dL — ABNORMAL HIGH (ref 0.61–1.24)
GFR calc non Af Amer: 45 mL/min — ABNORMAL LOW (ref >60)
GFR, EST AFRICAN AMERICAN: 53 mL/min — AB (ref >60)
Glucose, Bld: 122 mg/dL — ABNORMAL HIGH (ref 65–99)
Potassium: 4.5 mmol/L (ref 3.5–5.1)
Sodium: 140 mmol/L (ref 135–145)
Total Bilirubin: 0.6 mg/dL (ref 0.3–1.2)
Total Protein: 7.3 g/dL (ref 6.5–8.1)

## 2015-01-02 LAB — CBC WITH DIFFERENTIAL/PLATELET
BASOS ABS: 0 10*3/uL (ref 0.0–0.1)
BASOS PCT: 0 % (ref 0–1)
Eosinophils Absolute: 0.1 10*3/uL (ref 0.0–0.7)
Eosinophils Relative: 1 % (ref 0–5)
HCT: 47.2 % (ref 39.0–52.0)
Hemoglobin: 16 g/dL (ref 13.0–17.0)
Lymphocytes Relative: 15 % (ref 12–46)
Lymphs Abs: 1.3 10*3/uL (ref 0.7–4.0)
MCH: 31.7 pg (ref 26.0–34.0)
MCHC: 33.9 g/dL (ref 30.0–36.0)
MCV: 93.7 fL (ref 78.0–100.0)
MONO ABS: 1.4 10*3/uL — AB (ref 0.1–1.0)
Monocytes Relative: 16 % — ABNORMAL HIGH (ref 3–12)
NEUTROS ABS: 6.1 10*3/uL (ref 1.7–7.7)
NEUTROS PCT: 68 % (ref 43–77)
PLATELETS: 120 10*3/uL — AB (ref 150–400)
RBC: 5.04 MIL/uL (ref 4.22–5.81)
RDW: 14.6 % (ref 11.5–15.5)
WBC: 8.9 10*3/uL (ref 4.0–10.5)

## 2015-01-02 LAB — URINE MICROSCOPIC-ADD ON

## 2015-01-02 LAB — BRAIN NATRIURETIC PEPTIDE: B Natriuretic Peptide: 146 pg/mL — ABNORMAL HIGH (ref 0.0–100.0)

## 2015-01-02 LAB — I-STAT TROPONIN, ED: Troponin i, poc: 0.02 ng/mL (ref 0.00–0.08)

## 2015-01-02 LAB — I-STAT CG4 LACTIC ACID, ED
Lactic Acid, Venous: 1.5 mmol/L (ref 0.5–2.0)
Lactic Acid, Venous: 1.05 mmol/L (ref 0.5–2.0)

## 2015-01-02 MED ORDER — GALANTAMINE HYDROBROMIDE 4 MG PO TABS
12.0000 mg | ORAL_TABLET | Freq: Two times a day (BID) | ORAL | Status: DC
  Administered 2015-01-03 – 2015-01-04 (×4): 12 mg via ORAL
  Filled 2015-01-02 (×5): qty 3

## 2015-01-02 MED ORDER — CEFTRIAXONE SODIUM 1 G IJ SOLR
1.0000 g | Freq: Once | INTRAMUSCULAR | Status: AC
  Administered 2015-01-02: 1 g via INTRAVENOUS
  Filled 2015-01-02: qty 10

## 2015-01-02 MED ORDER — ALBUTEROL SULFATE (2.5 MG/3ML) 0.083% IN NEBU
5.0000 mg | INHALATION_SOLUTION | Freq: Once | RESPIRATORY_TRACT | Status: AC
  Administered 2015-01-02: 5 mg via RESPIRATORY_TRACT
  Filled 2015-01-02: qty 6

## 2015-01-02 MED ORDER — METHYLPREDNISOLONE SODIUM SUCC 125 MG IJ SOLR
60.0000 mg | Freq: Two times a day (BID) | INTRAMUSCULAR | Status: DC
  Administered 2015-01-03 – 2015-01-04 (×4): 60 mg via INTRAVENOUS
  Filled 2015-01-02 (×5): qty 0.96

## 2015-01-02 MED ORDER — ATORVASTATIN CALCIUM 10 MG PO TABS
10.0000 mg | ORAL_TABLET | Freq: Every day | ORAL | Status: DC
  Administered 2015-01-03: 10 mg via ORAL
  Filled 2015-01-02 (×2): qty 1

## 2015-01-02 MED ORDER — DEXTROSE 5 % IV SOLN
500.0000 mg | Freq: Once | INTRAVENOUS | Status: AC
  Administered 2015-01-02: 500 mg via INTRAVENOUS
  Filled 2015-01-02: qty 500

## 2015-01-02 MED ORDER — NITROGLYCERIN 0.4 MG SL SUBL: 0.4000 mg | SUBLINGUAL_TABLET | SUBLINGUAL | Status: DC | PRN

## 2015-01-02 MED ORDER — ALUM & MAG HYDROXIDE-SIMETH 200-200-20 MG/5ML PO SUSP: 30.0000 mL | Freq: Four times a day (QID) | ORAL | Status: DC | PRN

## 2015-01-02 MED ORDER — HEPARIN SODIUM (PORCINE) 5000 UNIT/ML IJ SOLN
5000.0000 [IU] | Freq: Three times a day (TID) | INTRAMUSCULAR | Status: DC
  Administered 2015-01-03 (×4): 5000 [IU] via SUBCUTANEOUS
  Filled 2015-01-02 (×8): qty 1

## 2015-01-02 MED ORDER — ACETAMINOPHEN 325 MG PO TABS
650.0000 mg | ORAL_TABLET | Freq: Four times a day (QID) | ORAL | Status: DC | PRN
Start: 2015-01-02 — End: 2015-01-04

## 2015-01-02 MED ORDER — LEVOFLOXACIN IN D5W 750 MG/150ML IV SOLN
750.0000 mg | INTRAVENOUS | Status: DC
  Administered 2015-01-03: 750 mg via INTRAVENOUS
  Filled 2015-01-02 (×2): qty 150

## 2015-01-02 MED ORDER — TAMSULOSIN HCL 0.4 MG PO CAPS
0.4000 mg | ORAL_CAPSULE | Freq: Every day | ORAL | Status: DC
Start: 2015-01-03 — End: 2015-01-04
  Administered 2015-01-03 – 2015-01-04 (×2): 0.4 mg via ORAL
  Filled 2015-01-02 (×3): qty 1

## 2015-01-02 MED ORDER — DIVALPROEX SODIUM 250 MG PO DR TAB
250.0000 mg | DELAYED_RELEASE_TABLET | Freq: Two times a day (BID) | ORAL | Status: DC
  Administered 2015-01-03 – 2015-01-04 (×4): 250 mg via ORAL
  Filled 2015-01-02 (×5): qty 1

## 2015-01-02 MED ORDER — MEMANTINE HCL 10 MG PO TABS
10.0000 mg | ORAL_TABLET | Freq: Two times a day (BID) | ORAL | Status: DC
  Administered 2015-01-03 – 2015-01-04 (×4): 10 mg via ORAL
  Filled 2015-01-02 (×5): qty 1

## 2015-01-02 MED ORDER — CITALOPRAM HYDROBROMIDE 10 MG PO TABS
10.0000 mg | ORAL_TABLET | Freq: Every day | ORAL | Status: DC
  Administered 2015-01-03 – 2015-01-04 (×2): 10 mg via ORAL
  Filled 2015-01-02 (×2): qty 1

## 2015-01-02 MED ORDER — POLYETHYLENE GLYCOL 3350 17 G PO PACK
17.0000 g | PACK | Freq: Every day | ORAL | Status: DC | PRN
Start: 2015-01-02 — End: 2015-01-04

## 2015-01-02 MED ORDER — PROMETHAZINE HCL 25 MG PO TABS: 12.5000 mg | ORAL_TABLET | Freq: Four times a day (QID) | ORAL | Status: DC | PRN

## 2015-01-02 MED ORDER — ACETAMINOPHEN 650 MG RE SUPP: 650.0000 mg | Freq: Four times a day (QID) | RECTAL | Status: DC | PRN

## 2015-01-02 MED ORDER — IPRATROPIUM-ALBUTEROL 0.5-2.5 (3) MG/3ML IN SOLN
3.0000 mL | RESPIRATORY_TRACT | Status: DC | PRN
  Administered 2015-01-03: 3 mL via RESPIRATORY_TRACT
  Filled 2015-01-02: qty 3

## 2015-01-02 MED ORDER — CLOPIDOGREL BISULFATE 75 MG PO TABS
75.0000 mg | ORAL_TABLET | Freq: Every day | ORAL | Status: DC
  Administered 2015-01-03 – 2015-01-04 (×2): 75 mg via ORAL
  Filled 2015-01-02 (×3): qty 1

## 2015-01-02 NOTE — ED Notes (Signed)
Unable to collect blood specimens, phlebotomy made aware.

## 2015-01-02 NOTE — ED Provider Notes (Signed)
CSN: [id number]     Arrival date & time 01/02/15  1818 History   First MD Initiated Contact with Patient 01/02/15 1857     Chief Complaint  Patient presents with  . Shortness of Breath  . Cough     Patient is a 81 y.o. male presenting with shortness of breath and cough. The history is provided by the spouse. No language interpreter was used.  Shortness of Breath Associated symptoms: cough   Cough Associated symptoms: shortness of breath    Trevor Lutz presents for evaluation of SOB/Cough.  Level V caveat due to dementia.  Hx is provided by wife.  She reports that he has increased cough/sob for the last two days, increased generalized weakness and he was shaking today.  He has a hx/o chronic SOB and cough, but this is a significant change.  His cough is productive of yellow sputum.  No recent fevers, vomiting, diarrhea.  He has 24 hour nursing care at home and the CNA that routinely cares for him was worried, she noted that his lips looked blue.  SpO2 at home was 93%  Past Medical History  Diagnosis Date  . Hypertension   . BPH (benign prostatic hyperplasia)     HAS INDWELLING FOLEY CATHETER  . Alzheimer disease   . Hyperlipidemia   . Erectile dysfunction   . Mild depression   . Spinal stenosis   . CVA (cerebral vascular accident) 2010    Left basal ganglia  . Acute CVA (cerebrovascular accident) 10/25/13    Left frontal lobe, left parietal lobe, left occipital lobe  . Separation of left acromioclavicular joint, type 3 2003  . Hemiparesis, right 11/07/2013  . Aphasia due to recent cerebral infarction 11/07/2013  . Hypertrophy of prostate with urinary obstruction and other lower urinary tract symptoms (LUTS) 11/07/2013  . Right inguinal hernia 09/26/2012   Past Surgical History  Procedure Laterality Date  . Kidney surgery      right kidney removed due  to traumatic injury  . Tonsilectomy, adenoidectomy, bilateral myringotomy and tubes    . Hernia repair Left 09/24/1996    Indirect,  Dr Lucia Gaskins surgeon  . Transurethral resection of prostate N/A 11/13/2012    Procedure: BI POLAR TRANSURETHRAL RESECTION OF THE PROSTATE (TURP);  Surgeon: Fredricka Bonine, MD;  Location: [facility] ORS;  Service: Urology;  Laterality: N/A;  . Tee without cardioversion N/A 10/23/2013    Procedure: TRANSESOPHAGEAL ECHOCARDIOGRAM (TEE);  Surgeon: Thayer Headings, MD;  Location: [facility] ENDOSCOPY;  Service: Cardiovascular;  Laterality: N/A;  . Loop recorder implant  10-24-2013    MDT LinQ implanted by Dr Caryl Comes for cryptogenic stroke  . Orif acromioclavicular joint Left 2003  . Loop recorder implant N/A 10/24/2013    Procedure: LOOP RECORDER IMPLANT;  Surgeon: Deboraha Sprang, MD;  Location: [facility] CATH LAB;  Service: Cardiovascular;  Laterality: N/A;   Family History  Problem Relation Age of Onset  . Heart disease Father   . Heart disease Mother    History  Substance Use Topics  . Smoking status: Former Smoker    Types: Cigarettes  . Smokeless tobacco: Never Used     Comment: QUIT SMOKING MANY YEARS AGO  . Alcohol Use: 1.2 oz/week    2 Glasses of wine per week     Comment: 2 glasses a week    Review of Systems  Respiratory: Positive for cough and shortness of breath.   All other systems reviewed and are negative.     Allergies  Review of patient's allergies indicates no known allergies.  Home Medications   Prior to Admission medications   Medication Sig Start Date End Date Taking? Authorizing Provider  cholecalciferol (VITAMIN D) 1000 UNITS tablet Take 1,000 Units by mouth every morning.     Historical Provider, MD  citalopram (CELEXA) 10 MG tablet Take 10 mg by mouth every morning.     Historical Provider, MD  clopidogrel (PLAVIX) 75 MG tablet Take 75 mg by mouth daily with breakfast.    Historical Provider, MD  divalproex (DEPAKOTE) 250 MG DR tablet Take 250 mg by mouth 2 (two) times daily.     Historical Provider, MD  galantamine (RAZADYNE) 12 MG tablet Take 12 mg by mouth 2 (two) times  daily.    Historical Provider, MD  guaiFENesin-dextromethorphan (ROBITUSSIN DM) 100-10 MG/5ML syrup Take 10 mLs by mouth every 4 (four) hours as needed for cough.    Historical Provider, MD  l-methylfolate-b2-b6-b12 (CEREFOLIN) 11-12-48-5 MG TABS Take 1 tablet by mouth every morning.     Historical Provider, MD  memantine (NAMENDA) 10 MG tablet Take 10 mg by mouth 2 (two) times daily.    Historical Provider, MD  Multiple Vitamin (MULTIVITAMIN) tablet Take 1 tablet by mouth every morning.     Historical Provider, MD  nitroGLYCERIN (NITROSTAT) 0.4 MG SL tablet Place 0.4 mg under the tongue every 5 (five) minutes as needed for chest pain. Administer 3 doses sl for chest pain, notify MD/NP if ineffective.    Historical Provider, MD  tamsulosin (FLOMAX) 0.4 MG CAPS Take 0.4 mg by mouth daily after breakfast.     Historical Provider, MD  vitamin B-12 (CYANOCOBALAMIN) 100 MCG tablet Take 100 mcg by mouth every morning.     Historical Provider, MD   Ht 5' 8.5" (1.74 m)  Wt 187 lb (84.823 kg)  BMI 28.02 kg/m2  SpO2 98% Physical Exam  Constitutional: He appears well-developed and well-nourished.  HENT:  Head: Normocephalic and atraumatic.  Cardiovascular: Normal rate and regular rhythm.   No murmur heard. Pulmonary/Chest:  Tachypnea, decreased air movement in the right lung base, occasional wheezes bilaterally.   Abdominal: Soft. There is no tenderness. There is no rebound and no guarding.  Musculoskeletal: He exhibits no edema or tenderness.  Neurological: He is alert.  Generalized weakness, confused  Skin: Skin is warm and dry.  Psychiatric: He has a normal mood and affect. His behavior is normal.  Nursing note and vitals reviewed.   ED Course  Procedures (including critical care time) Labs Review Labs Reviewed  CBC WITH DIFFERENTIAL/PLATELET - Abnormal; Notable for the following:    Platelets 120 (*)    Monocytes Relative 16 (*)    Monocytes Absolute 1.4 (*)    All other components  within normal limits  COMPREHENSIVE METABOLIC PANEL - Abnormal; Notable for the following:    Glucose, Bld 122 (*)    Creatinine, Ser 1.32 (*)    Calcium 8.7 (*)    ALT 16 (*)    GFR calc non Af Amer 45 (*)    GFR calc Af Amer 53 (*)    All other components within normal limits  BRAIN NATRIURETIC PEPTIDE - Abnormal; Notable for the following:    B Natriuretic Peptide 146.0 (*)    All other components within normal limits  URINALYSIS, ROUTINE W REFLEX MICROSCOPIC (NOT AT [facility]) - Abnormal; Notable for the following:    Hgb urine dipstick TRACE (*)    Ketones, ur 15 (*)    Protein,  ur 30 (*)    All other components within normal limits  URINE CULTURE  CULTURE, BLOOD (ROUTINE X 2)  CULTURE, BLOOD (ROUTINE X 2)  URINE MICROSCOPIC-ADD ON  CBC  CREATININE, SERUM  BASIC METABOLIC PANEL  CBC  I-STAT TROPOININ, ED  I-STAT CG4 LACTIC ACID, ED  I-STAT CG4 LACTIC ACID, ED    Imaging Review Dg Chest 2 View  01/02/2015   CLINICAL DATA:  Productive cough for the past 4 days with increasing weakness. Shortness of breath. Hypoxia.  EXAM: CHEST  2 VIEW  COMPARISON:  04/02/2014.  FINDINGS: Normal sized heart. Poor inspiration. Mild linear density at the right lung base with little change. Interval mild patchy and linear density at the left lung base. Mild diffuse peribronchial thickening with mild progression. Stable right diaphragmatic eventration. Thoracic spine degenerative changes.  IMPRESSION: 1. Poor inspiration with mild left lower lobe atelectasis and possible pneumonia. 2. Stable mild linear scarring at the right lung base with a right diaphragmatic eventration. 3. Mild bronchitic changes with mild progression.   Electronically Signed   By: Claudie Revering M.D.   On: 01/02/2015 20:03     EKG Interpretation   Date/Time:  Thursday January 02 2015 18:49:02 EDT Ventricular Rate:  66 PR Interval:  158 QRS Duration: 89 QT Interval:  411 QTC Calculation: 431 R Axis:   48 Text Interpretation:   Sinus rhythm Abnormal R-wave progression, early  transition Confirmed by Hazle Coca [phone]) on 01/02/2015 7:36:10 PM      MDM   Final diagnoses:  CAP (community acquired pneumonia)    Pt here for increased SOB/cough.  CXR concerning for pna, pt with hypoxia in department (88% O2 sat at rest).  Plan to admit for abx.  Discussed with wife (POA) code status - Trevor. Lutz is DNR/DNI, supplemental oxygen is ok.  Discussed with hospitalist regarding admission.      Quintella Reichert, MD 01/03/15 0020

## 2015-01-02 NOTE — ED Notes (Signed)
Pt presents via EMS c/o progressive productive cough x 4 days with increasing weakness.  This afternoon his family and caregiver noticed that he appeared to be short of breath and his lips were blue/purple, so they called EMS.  EMS reports that pt oxygen saturation was 93% on scene and increased to 97% on 4L.  After arriving to the ED, he developed audible expiratory wheezes bilaterally which EMS reports were not present when they responded to his home.  Family reports that pt has hx of dementia and is ambulatory at baseline, although with his current weakness assistance is advised for safety reasons.  Pt denies pain, fever, nausea, vomiting, diarrhea.  Pt is currently on 5L oxygen nasal cannula and oxygen saturation is 98%.  He is coughing continuously (nonproductively) throughout the assessment.

## 2015-01-02 NOTE — H&P (Signed)
PCP:   Irven Shelling, MD   Chief Complaint:  SOB  HPI: This is a 81 year old gentleman with severe dementia. He lives at home and has around-the-clock nursing care. Nurse's aid at bedside but she provides scant history. Apparently patient was out having lunch and was noted to have slightly bluish lips. He is brought to the ER. In the ER he is wheezing. His chest x-ray shows probably pneumonia. He has a new oxygen need so the hospitalist have been requested to admit. The patient is unable to give any history.    Review of Systems:  Unable to obtain d/t dementia   Past Medical History: Past Medical History  Diagnosis Date  . Hypertension   . BPH (benign prostatic hyperplasia)     HAS INDWELLING FOLEY CATHETER  . Alzheimer disease   . Hyperlipidemia   . Erectile dysfunction   . Mild depression   . Spinal stenosis   . CVA (cerebral vascular accident) 2010    Left basal ganglia  . Acute CVA (cerebrovascular accident) 10/25/13    Left frontal lobe, left parietal lobe, left occipital lobe  . Separation of left acromioclavicular joint, type 3 2003  . Hemiparesis, right 11/07/2013  . Aphasia due to recent cerebral infarction 11/07/2013  . Hypertrophy of prostate with urinary obstruction and other lower urinary tract symptoms (LUTS) 11/07/2013  . Right inguinal hernia 09/26/2012   Past Surgical History  Procedure Laterality Date  . Kidney surgery      right kidney removed due  to traumatic injury  . Tonsilectomy, adenoidectomy, bilateral myringotomy and tubes    . Hernia repair Left 09/24/1996    Indirect, Dr Lucia Gaskins surgeon  . Transurethral resection of prostate N/A 11/13/2012    Procedure: BI POLAR TRANSURETHRAL RESECTION OF THE PROSTATE (TURP);  Surgeon: Fredricka Bonine, MD;  Location: [facility] ORS;  Service: Urology;  Laterality: N/A;  . Tee without cardioversion N/A 10/23/2013    Procedure: TRANSESOPHAGEAL ECHOCARDIOGRAM (TEE);  Surgeon: Thayer Headings, MD;  Location: [facility]  ENDOSCOPY;  Service: Cardiovascular;  Laterality: N/A;  . Loop recorder implant  10-24-2013    MDT LinQ implanted by Dr Caryl Comes for cryptogenic stroke  . Orif acromioclavicular joint Left 2003  . Loop recorder implant N/A 10/24/2013    Procedure: LOOP RECORDER IMPLANT;  Surgeon: Deboraha Sprang, MD;  Location: [facility] CATH LAB;  Service: Cardiovascular;  Laterality: N/A;    Medications: Prior to Admission medications   Medication Sig Start Date End Date Taking? Authorizing Provider  atorvastatin (LIPITOR) 10 MG tablet Take 10 mg by mouth daily.   Yes Historical Provider, MD  cholecalciferol (VITAMIN D) 1000 UNITS tablet Take 1,000 Units by mouth every morning.    Yes Historical Provider, MD  citalopram (CELEXA) 10 MG tablet Take 10 mg by mouth every morning.    Yes Historical Provider, MD  clopidogrel (PLAVIX) 75 MG tablet Take 75 mg by mouth daily with breakfast.   Yes Historical Provider, MD  divalproex (DEPAKOTE) 250 MG DR tablet Take 250 mg by mouth 2 (two) times daily.    Yes Historical Provider, MD  galantamine (RAZADYNE) 12 MG tablet Take 12 mg by mouth 2 (two) times daily.   Yes Historical Provider, MD  guaiFENesin-dextromethorphan (ROBITUSSIN DM) 100-10 MG/5ML syrup Take 10 mLs by mouth every 4 (four) hours as needed for cough.   Yes Historical Provider, MD  l-methylfolate-b2-b6-b12 (CEREFOLIN) 11-12-48-5 MG TABS Take 1 tablet by mouth every morning.    Yes Historical Provider, MD  memantine (  NAMENDA) 10 MG tablet Take 10 mg by mouth 2 (two) times daily.   Yes Historical Provider, MD  Multiple Vitamin (MULTIVITAMIN) tablet Take 1 tablet by mouth every morning.    Yes Historical Provider, MD  nitroGLYCERIN (NITROSTAT) 0.4 MG SL tablet Place 0.4 mg under the tongue every 5 (five) minutes as needed for chest pain. Administer 3 doses sl for chest pain, notify MD/NP if ineffective.   Yes Historical Provider, MD  tamsulosin (FLOMAX) 0.4 MG CAPS Take 0.4 mg by mouth daily after breakfast.    Yes  Historical Provider, MD  vitamin B-12 (CYANOCOBALAMIN) 100 MCG tablet Take 100 mcg by mouth every morning.    Yes Historical Provider, MD    Allergies:  No Known Allergies  Social History:  reports that he has quit smoking. His smoking use included Cigarettes. He has never used smokeless tobacco. He reports that he drinks about 1.2 oz of alcohol per week. He reports that he does not use illicit drugs.  Family History: Family History  Problem Relation Age of Onset  . Heart disease Father   . Heart disease Mother     Physical Exam: Filed Vitals:   01/02/15 1840 01/02/15 2121 01/02/15 2124 01/02/15 2214  BP:  143/73    Pulse:  75    Temp:    99.1 F (37.3 C)  TempSrc:    Oral  Resp:  23    Height:      Weight:      SpO2: 98% 91% 94%     General:  Pleasantly demented Eyes: PERRLA, pink conjunctiva, no scleral icterus ENT: Moist oral mucosa, neck supple, no thyromegaly Lungs: positive wheeze, no crackles, mild use of accessory muscles Cardiovascular: regular rate and rhythm, no regurgitation, no gallops, no murmurs. No carotid bruits, no JVD Abdomen: soft, positive BS, non-tender, non-distended, no organomegaly, not an acute abdomen GU: not examined Neuro: unable to evaluate d/t dementia Musculoskeletal: unable to evaluate d/t dementia Skin: no rash, no subcutaneous crepitation, no decubitus Psych: Pleasantly demented   Labs on Admission:   Recent Labs  01/02/15 1935  NA 140  K 4.5  CL 105  CO2 26  GLUCOSE 122*  BUN 20  CREATININE 1.32*  CALCIUM 8.7*    Recent Labs  01/02/15 1935  AST 25  ALT 16*  ALKPHOS 52  BILITOT 0.6  PROT 7.3  ALBUMIN 4.0   No results for input(s): LIPASE, AMYLASE in the last 72 hours.  Recent Labs  01/02/15 1935  WBC 8.9  NEUTROABS 6.1  HGB 16.0  HCT 47.2  MCV 93.7  PLT 120*    Micro Results: No results found for this or any previous visit (from the past 240 hour(s)).   Radiological Exams on Admission: Dg Chest 2  View  01/02/2015   CLINICAL DATA:  Productive cough for the past 4 days with increasing weakness. Shortness of breath. Hypoxia.  EXAM: CHEST  2 VIEW  COMPARISON:  04/02/2014.  FINDINGS: Normal sized heart. Poor inspiration. Mild linear density at the right lung base with little change. Interval mild patchy and linear density at the left lung base. Mild diffuse peribronchial thickening with mild progression. Stable right diaphragmatic eventration. Thoracic spine degenerative changes.  IMPRESSION: 1. Poor inspiration with mild left lower lobe atelectasis and possible pneumonia. 2. Stable mild linear scarring at the right lung base with a right diaphragmatic eventration. 3. Mild bronchitic changes with mild progression.   Electronically Signed   By: Percell Locus.D.  On: 01/02/2015 20:03    Assessment/Plan Present on Admission:  . Community acquired pneumonia Acute exacerbation COPD -Admit to MedSurg -Blood cultures 2, -Oxygen, nebulizers, respiratory evaluate and treat -Solu-Medrol Acute kidney injury -Hydrated with normal saline  -Repeat BMP in a.m.  -No nephrotoxic medications  . Dementia . Dyslipidemia . Essential hypertension . CVA (cerebral infarction) -Stable, home medications resumed  .   Trevor Lutz 01/02/2015, 10:49 PM

## 2015-01-02 NOTE — ED Notes (Addendum)
Admitting doctor in with pt//delay on lab draw

## 2015-01-02 NOTE — ED Notes (Signed)
Pt oxygen saturation 88-89% on room air; 2L oxygen applied via nasal cannula.  Saturation now reads 96% on 2L.

## 2015-01-02 NOTE — ED Notes (Signed)
Nurse drawing labs. 

## 2015-01-03 DIAGNOSIS — J189 Pneumonia, unspecified organism: Principal | ICD-10-CM

## 2015-01-03 LAB — BASIC METABOLIC PANEL
Anion gap: 7 (ref 5–15)
BUN: 18 mg/dL (ref 6–20)
CALCIUM: 8 mg/dL — AB (ref 8.9–10.3)
CO2: 26 mmol/L (ref 22–32)
Chloride: 104 mmol/L (ref 101–111)
Creatinine, Ser: 1.11 mg/dL (ref 0.61–1.24)
GFR calc non Af Amer: 56 mL/min — ABNORMAL LOW (ref >60)
Glucose, Bld: 131 mg/dL — ABNORMAL HIGH (ref 65–99)
Potassium: 4.4 mmol/L (ref 3.5–5.1)
Sodium: 137 mmol/L (ref 135–145)

## 2015-01-03 LAB — CBC
HCT: 42.6 % (ref 39.0–52.0)
Hemoglobin: 14.2 g/dL (ref 13.0–17.0)
MCH: 31.5 pg (ref 26.0–34.0)
MCHC: 33.3 g/dL (ref 30.0–36.0)
MCV: 94.5 fL (ref 78.0–100.0)
Platelets: 119 10*3/uL — ABNORMAL LOW (ref 150–400)
RBC: 4.51 MIL/uL (ref 4.22–5.81)
RDW: 14.7 % (ref 11.5–15.5)
WBC: 7.1 10*3/uL (ref 4.0–10.5)

## 2015-01-03 NOTE — Progress Notes (Signed)
Pt hasn't voided since arrival at 2330 last night, denies urge to void at this time. Bladder scan was 234cc. Will give pt a little more time to void on his own since scan is below 300cc. Continue to monitor. Hortencia Conradi RN

## 2015-01-03 NOTE — Progress Notes (Signed)
KEY CEN LOV:[id number] DOB: Sep 01, 1921 DOA: 01/02/2015 PCP: Irven Shelling, MD  Brief narrative: 81 y/o ? Prior episodic admission for chest pain 11/03/13 , Cryptogenic stroke 10/21/13, Medtronic implantable loop recorder placed 10/24/13, reported dementia,s/p TURP 11/13/12, reducible right inguinal hernia, prior CVA, erectile dysfunction admitted from home with 24 7 care with acute respiratory failure, CXR = pneumonia  Past medical history-As per Problem list Chart reviewed as below- =  Consultants:    Procedures:    Antibiotics:  Azithromycin 7/21  Ceftriaxone 7/21   Subjective   Alert pleasant but not oriented Thinks this is 2010. Masks his confusion well Tells me that the president is NOT Ida Rogue but does not answer the question Multiple caregivers in the room Discussed with wife on telephone She tells me that he saw that he had blue lips when she went outside and that she thought that he had a UTI although he has had no dark or orders urine She wonders about oxygen requirements and further plan.     Objective    Interim History:   Telemetry: None telemetry   Objective: Filed Vitals:   01/02/15 2124 01/02/15 2214 01/02/15 2341 01/03/15 0027  BP:   117/55   Pulse:   67   Temp:  99.1 F (37.3 C) 98.6 F (37 C)   TempSrc:  Oral Oral   Resp:   22   Height:   5\' 8"  (1.727 m)   Weight:   75.7 kg (166 lb 14.2 oz)   SpO2: 94%  96% 98%   No intake or output data in the 24 hours ending 01/03/15 0703  Exam:  General: EOMI NCAT Cards S1-S2 no murmur rub or gallop regular rate rhythm  Respiratory: No basal lung sound Abd: Soft nontender nondistended no rebound or guarding  Skinno lower extremity edema  Data Reviewed: Basic Metabolic Panel:  Recent Labs Lab 01/02/15 1935 01/03/15 0144  NA 140 137  K 4.5 4.4  CL 105 104  CO2 26 26  GLUCOSE 122* 131*  BUN 20 18  CREATININE 1.32* 1.11  CALCIUM 8.7* 8.0*   Liver Function  Tests:  Recent Labs Lab 01/02/15 1935  AST 25  ALT 16*  ALKPHOS 52  BILITOT 0.6  PROT 7.3  ALBUMIN 4.0   No results for input(s): LIPASE, AMYLASE in the last 168 hours. No results for input(s): AMMONIA in the last 168 hours. CBC:  Recent Labs Lab 01/02/15 1935 01/03/15 0144  WBC 8.9 7.1  NEUTROABS 6.1  --   HGB 16.0 14.2  HCT 47.2 42.6  MCV 93.7 94.5  PLT 120* 119*   Cardiac Enzymes: No results for input(s): CKTOTAL, CKMB, CKMBINDEX, TROPONINI in the last 168 hours. BNP: Invalid input(s): POCBNP CBG: No results for input(s): GLUCAP in the last 168 hours.  No results found for this or any previous visit (from the past 240 hour(s)).   Studies:              All Imaging reviewed and is as per above notation   Scheduled Meds: . atorvastatin  10 mg Oral q1800  . citalopram  10 mg Oral Daily  . clopidogrel  75 mg Oral Q breakfast  . divalproex  250 mg Oral BID  . galantamine  12 mg Oral BID  . heparin  5,000 Units Subcutaneous 3 times per day  . levofloxacin (LEVAQUIN) IV  750 mg Intravenous Q48H  . memantine  10 mg Oral BID  . methylPREDNISolone (SOLU-MEDROL) injection  60 mg Intravenous BID  . tamsulosin  0.4 mg Oral QPC breakfast   Continuous Infusions:    Assessment/Plan:  Principal Problem:   Community acquired pneumonia -Differential diagnosis aspiration given dementia -Monitor for coughing after eating and if there is a concern we'll get speech therapy involved -Continue ceftriaxone and azithromycin -Repeat CBC and chest x-ray 7/20 3 AM  Active Problems:   CVA (cerebral infarction) -Multiple prior CVAs in the past per wife -Probable multi-infarct dementia >just dementia -Consider neuropsych/gerontology eval -Continue Plavix 75 daily    Essential hypertension -Not on any antihypertensives? Consider amlodipine    Dementia Reorient with sitter and private care nursing- -ould consider transitioning off Depakote and only using citalopram for mood  disturbances as an outpatient per PCP -No current evidence for use of Depakote in this population -Continue galantamine 12 mg twice a day -Continue Namenda 10 twice a day    Dyslipidemia -Continue atorvastatin 10 for secondary prevention    Depression  See above discussion  Prior history of traumatic kidney injury with 1 kidney present -Monitor labs a.m.  -Status post TURP -Outpatient neurology input  Code Status: DO NOT RESUSCITATE Family Communication: No family present but long discussion on the phone with family Disposition Plan: Inpatient for now   Verneita Griffes, MD  [facility] Pager [phone] 01/03/2015, 7:03 AM    LOS: 1 day

## 2015-01-03 NOTE — Plan of Care (Signed)
Problem: Phase I Progression Outcomes Goal: Code status addressed with pt/family Outcome: Completed/Met Date Met:  01/03/15 Pt is DNR

## 2015-01-03 NOTE — Care Management Important Message (Signed)
Important Message  Patient Details  Name: Trevor Lutz MRN: [id number] Date of Birth: [birth year]   Medicare Important Message Given:  Yes-second notification given    Shelda Altes 01/03/2015, 1:27 Emily Message  Patient Details  Name: Trevor Lutz MRN: [id number] Date of Birth: May 26, 1922   Medicare Important Message Given:  Yes-second notification given    Shelda Altes 01/03/2015, 1:26 PM

## 2015-01-03 NOTE — Care Management Note (Signed)
Case Management Note  Patient Details  Name: FLOYD WADE MRN: [id number] Date of Birth: Feb 16, 1922  Subjective/Objective: 81 y/o m admitted w/pna. From home.Would recommend PT cons.                   Action/Plan:d/c plan home.    Expected Discharge Date:                  Expected Discharge Plan:  Home/Self Care  In-House Referral:     Discharge planning Services  CM Consult  Post Acute Care Choice:    Choice offered to:     DME Arranged:    DME Agency:     HH Arranged:    HH Agency:     Status of Service:  In process, will continue to follow  Medicare Important Message Given:  Yes-second notification given Date Medicare IM Given:    Medicare IM give by:    Date Additional Medicare IM Given:    Additional Medicare Important Message give by:     If discussed at [place] of Stay Meetings, dates discussed:    Additional Comments:  Dessa Phi, RN 01/03/2015, 2:34 PM

## 2015-01-03 NOTE — Progress Notes (Signed)
Pt admitted to 1324 accompanied by private duty caregiver. Pt with dementia A&Ox2, caregiver assisted with admission history as much as possible. Pt began wheezing and pursed lip breathing after transfer from stretcher to bed, RT called to give neb treatment. Breathing much improved afterward. Caregiver to remain at bedside tonight. Hortencia Conradi RN

## 2015-01-04 ENCOUNTER — Inpatient Hospital Stay (HOSPITAL_COMMUNITY): Payer: Medicare PPO

## 2015-01-04 DIAGNOSIS — I69320 Aphasia following cerebral infarction: Secondary | ICD-10-CM

## 2015-01-04 DIAGNOSIS — I10 Essential (primary) hypertension: Secondary | ICD-10-CM

## 2015-01-04 DIAGNOSIS — F0391 Unspecified dementia with behavioral disturbance: Secondary | ICD-10-CM

## 2015-01-04 DIAGNOSIS — E785 Hyperlipidemia, unspecified: Secondary | ICD-10-CM

## 2015-01-04 DIAGNOSIS — G8190 Hemiplegia, unspecified affecting unspecified side: Secondary | ICD-10-CM

## 2015-01-04 DIAGNOSIS — J690 Pneumonitis due to inhalation of food and vomit: Secondary | ICD-10-CM

## 2015-01-04 LAB — URINE CULTURE: CULTURE: NO GROWTH

## 2015-01-04 LAB — CBC
HCT: 41.5 % (ref 39.0–52.0)
Hemoglobin: 13.8 g/dL (ref 13.0–17.0)
MCH: 30.7 pg (ref 26.0–34.0)
MCHC: 33.3 g/dL (ref 30.0–36.0)
MCV: 92.2 fL (ref 78.0–100.0)
PLATELETS: 127 10*3/uL — AB (ref 150–400)
RBC: 4.5 MIL/uL (ref 4.22–5.81)
RDW: 14.1 % (ref 11.5–15.5)
WBC: 12.9 10*3/uL — AB (ref 4.0–10.5)

## 2015-01-04 MED ORDER — LEVOFLOXACIN 750 MG PO TABS: 750.0000 mg | ORAL_TABLET | ORAL | Status: DC

## 2015-01-04 MED ORDER — LEVOFLOXACIN 750 MG PO TABS: 750.0000 mg | ORAL_TABLET | ORAL | Status: AC

## 2015-01-04 NOTE — Progress Notes (Signed)
Pt discharged home with caregiver, Magda Paganini in stable condition.  Discharge instructions given. Scripts sent to pharmacy of choice. Caregiver verbalized understanding.

## 2015-01-04 NOTE — Discharge Summary (Signed)
Physician Discharge Summary  Trevor Lutz HQI:[id number] DOB: 09/10/1921 DOA: 01/02/2015  PCP: Irven Shelling, MD  Admit date: 01/02/2015 Discharge date: 01/04/2015  Time spent: 40 minutes  Recommendations for Outpatient Follow-up:  1. Complete 2  more doses levaquin 2. Consider rpt CXR ~ 3-4 weeks  3. CBC + Cmet 1 week at PCP 4. Consider d/c Depakote as OP [no strong evidence for use for behavioural disturbances in dementia]   Discharge Diagnoses:  Principal Problem:   Community acquired pneumonia Active Problems:   CVA (cerebral infarction)   Essential hypertension   Dementia   Dyslipidemia   Depression   Hemiparesis, right   Aphasia due to recent cerebral infarction   Pneumonia   Discharge Condition: fair  Diet recommendation: reg  Filed Weights   01/02/15 1839 01/02/15 2341  Weight: 84.823 kg (187 lb) 75.7 kg (166 lb 14.2 oz)    Brief narrative: 81 y/o ? Prior episodic admission for chest pain 11/03/13 , Cryptogenic stroke 10/21/13, Medtronic implantable loop recorder placed 10/24/13, reported dementia,s/p TURP 11/13/12, reducible right inguinal hernia, prior CVA, erectile dysfunction admitted from home with 24 7 care with acute respiratory failure, CXR = pneumonia initially placed on broad spectrum CAP coverage and transitioned rapidly to PO oral levaquin Rpt CXR on am of d/c did not confirm PNA Urine cult was neg for pyelo His acute hypoxic resp failure was felt to have been 2/2 to subclinical aspiration-risk factors prior CVA/Dem[place]nia and I encouraged family to keep a close watch on him as OP as he does have 24/7 care at home He had no desaturations on walking and didn't require home o2 on d/c    Antibiotics:  Azithromycin 7/21-7/22  Ceftriaxone 7/21-7/22  IV Levaquin 7/22 changed to by mouth 7/23   Discharge Exam: Filed Vitals:   01/03/15 2049  Pulse: 64  Temp: 97.4 F (36.3 C)  Resp: 18    General: alert confused pleasant Cardiovascular:  s1  s2 no m/r/g Respiratory: clear  Discharge Instructions   Discharge Instructions    Diet - low sodium heart healthy    Complete by:  As directed      Discharge instructions    Complete by:  As directed   Complete 2 more dose of Levofloxacin tablets-I believe you might have had either a little bit of aspiration or a bronchitis, but to be sure would recommned completion of a total of 5 days antibiotics     Increase activity slowly    Complete by:  As directed           Current Discharge Medication List    START taking these medications   Details  levofloxacin (LEVAQUIN) 750 MG tablet Take 1 tablet (750 mg total) by mouth every other day. Qty: 2 tablet, Refills: 0      CONTINUE these medications which have NOT CHANGED   Details  atorvastatin (LIPITOR) 10 MG tablet Take 10 mg by mouth daily.    cholecalciferol (VITAMIN D) 1000 UNITS tablet Take 1,000 Units by mouth every morning.     citalopram (CELEXA) 10 MG tablet Take 10 mg by mouth every morning.     clopidogrel (PLAVIX) 75 MG tablet Take 75 mg by mouth daily with breakfast.    divalproex (DEPAKOTE) 250 MG DR tablet Take 250 mg by mouth 2 (two) times daily.     galantamine (RAZADYNE) 12 MG tablet Take 12 mg by mouth 2 (two) times daily.    guaiFENesin-dextromethorphan (ROBITUSSIN DM) 100-10 MG/5ML syrup Take 10  mLs by mouth every 4 (four) hours as needed for cough.    l-methylfolate-b2-b6-b12 (CEREFOLIN) 11-12-48-5 MG TABS Take 1 tablet by mouth every morning.     memantine (NAMENDA) 10 MG tablet Take 10 mg by mouth 2 (two) times daily.    Multiple Vitamin (MULTIVITAMIN) tablet Take 1 tablet by mouth every morning.     tamsulosin (FLOMAX) 0.4 MG CAPS Take 0.4 mg by mouth daily after breakfast.     vitamin B-12 (CYANOCOBALAMIN) 100 MCG tablet Take 100 mcg by mouth every morning.       STOP taking these medications     nitroGLYCERIN (NITROSTAT) 0.4 MG SL tablet        No Known Allergies    The results of  significant diagnostics from this hospitalization (including imaging, microbiology, ancillary and laboratory) are listed below for reference.    Significant Diagnostic Studies: Dg Chest 2 View  01/04/2015   CLINICAL DATA:  Shortness of Breath  EXAM: CHEST - 2 VIEW  COMPARISON:  01/02/2015  FINDINGS: Cardiac shadow is stable. A loop recorder is again seen. Focal eventration of the right hemidiaphragm is seen. The lungs are well aerated without focal confluent infiltrate. No acute bony abnormality is seen.  IMPRESSION: No active disease.   Electronically Signed   By: Inez Catalina M.D.   On: 01/04/2015 08:38   Dg Chest 2 View  01/02/2015   CLINICAL DATA:  Productive cough for the past 4 days with increasing weakness. Shortness of breath. Hypoxia.  EXAM: CHEST  2 VIEW  COMPARISON:  04/02/2014.  FINDINGS: Normal sized heart. Poor inspiration. Mild linear density at the right lung base with little change. Interval mild patchy and linear density at the left lung base. Mild diffuse peribronchial thickening with mild progression. Stable right diaphragmatic eventration. Thoracic spine degenerative changes.  IMPRESSION: 1. Poor inspiration with mild left lower lobe atelectasis and possible pneumonia. 2. Stable mild linear scarring at the right lung base with a right diaphragmatic eventration. 3. Mild bronchitic changes with mild progression.   Electronically Signed   By: Claudie Revering M.D.   On: 01/02/2015 20:03    Microbiology: Recent Results (from the past 240 hour(s))  Urine culture     Status: None   Collection Time: 01/02/15 10:12 PM  Result Value Ref Range Status   Specimen Description URINE, CATHETERIZED  Final   Special Requests NONE  Final   Culture   Final    NO GROWTH 1 DAY Performed at [facility]    Report Status 01/04/2015 FINAL  Final     Labs: Basic Metabolic Panel:  Recent Labs Lab 01/02/15 1935 01/03/15 0144  NA 140 137  K 4.5 4.4  CL 105 104  CO2 26 26  GLUCOSE 122*  131*  BUN 20 18  CREATININE 1.32* 1.11  CALCIUM 8.7* 8.0*   Liver Function Tests:  Recent Labs Lab 01/02/15 1935  AST 25  ALT 16*  ALKPHOS 52  BILITOT 0.6  PROT 7.3  ALBUMIN 4.0   No results for input(s): LIPASE, AMYLASE in the last 168 hours. No results for input(s): AMMONIA in the last 168 hours. CBC:  Recent Labs Lab 01/02/15 1935 01/03/15 0144 01/04/15 0403  WBC 8.9 7.1 12.9*  NEUTROABS 6.1  --   --   HGB 16.0 14.2 13.8  HCT 47.2 42.6 41.5  MCV 93.7 94.5 92.2  PLT 120* 119* 127*   Cardiac Enzymes: No results for input(s): CKTOTAL, CKMB, CKMBINDEX, TROPONINI in the last  168 hours. BNP: BNP (last 3 results)  Recent Labs  01/02/15 1935  BNP 146.0*    ProBNP (last 3 results) No results for input(s): PROBNP in the last 8760 hours.  CBG: No results for input(s): GLUCAP in the last 168 hours.     SignedNita Sells  [facility] 01/04/2015, 1:13 PM

## 2015-01-04 NOTE — Evaluation (Signed)
Physical Therapy Evaluation Patient Details Name: Trevor Lutz MRN: [id number] DOB: [birth year] Today's Date: 01/04/2015   History of Present Illness  81 y/o ? Prior episodic admission for chest pain 11/03/13 , Cryptogenic stroke 10/21/13, Medtronic implantable loop recorder placed 10/24/13, reported dementia,s/p TURP 11/13/12, reducible right inguinal hernia, prior CVA, erectile dysfunction admitted from home with 24 7 care with acute respiratory failure, CXR = pneumonia  Clinical Impression  Pt ambulated 60' with min hand held assist, with LOB x 1, SaO2 96% on RA walking. He will have 24* caregivers at home. No f/u PT needed. Instructed caregiver to provide hand held assist when pt is ambulating.     Follow Up Recommendations No PT follow up    Equipment Recommendations  None recommended by PT    Recommendations for Other Services       Precautions / Restrictions Precautions Precautions: Fall Restrictions Weight Bearing Restrictions: No      Mobility  Bed Mobility Overal bed mobility: Needs Assistance Bed Mobility: Supine to Sit     Supine to sit: Min assist     General bed mobility comments: min A to raise trunk  Transfers Overall transfer level: Needs assistance Equipment used: None Transfers: Sit to/from Stand Sit to Stand: Min assist         General transfer comment: min A to steady  Ambulation/Gait Ambulation/Gait assistance: Min assist;Min guard Ambulation Distance (Feet): 60 Feet Assistive device: 1 person hand held assist Gait Pattern/deviations: Step-through pattern;Decreased stride length   Gait velocity interpretation: at or above normal speed for age/gender General Gait Details: min to min/guard assist, posteror LOB x 1 when pt looked down to his feet required min A to prevent LOB; SaO2 96% on RA with walking, 2/4 dyspnea noted (caregiver stated this is baseline)  Science writer    Modified Rankin (Stroke Patients  Only)       Balance Overall balance assessment: Needs assistance   Sitting balance-Leahy Scale: Good       Standing balance-Leahy Scale: Fair                               Pertinent Vitals/Pain Pain Assessment: No/denies pain    Home Living Family/patient expects to be discharged to:: Private residence Living Arrangements: Spouse/significant other Available Help at Discharge: Personal care attendant;Available 24 hours/day Type of Home: House Home Access: Level entry     Home Layout: Two level Home Equipment: Cane - single point      Prior Function Level of Independence: Needs assistance   Gait / Transfers Assistance Needed: min HHA with walking, needing more help for 1 week PTA, CG denies falls  ADL's / Homemaking Assistance Needed: supervision for bathing/dressing due to dementia  Comments: used to play tennis and golf, has 24* Caregivers     Hand Dominance   Dominant Hand: Right    Extremity/Trunk Assessment   Upper Extremity Assessment: Overall WFL for tasks assessed           Lower Extremity Assessment: Overall WFL for tasks assessed      Cervical / Trunk Assessment: Normal  Communication   Communication: No difficulties  Cognition Arousal/Alertness: Awake/alert Behavior During Therapy: WFL for tasks assessed/performed Overall Cognitive Status: History of cognitive impairments - at baseline (can follow directions, unreliable historian, h/o dementia, private caregiver provided prior functional level)  General Comments      Exercises        Assessment/Plan    PT Assessment Patent does not need any further PT services (pt to DC home today per MD, he will have 24* assist there, instructed private caregiver to provide hand held assist for ambulation)  PT Diagnosis Generalized weakness   PT Problem List    PT Treatment Interventions     PT Goals (Current goals can be found in the Care Plan section)       Frequency     Barriers to discharge        Co-evaluation               End of Session Equipment Utilized During Treatment: Gait belt Activity Tolerance: Patient tolerated treatment well Patient left: in chair;with call bell/phone within reach;with family/visitor present Nurse Communication: Mobility status         Time: 5643-3295 PT Time Calculation (min) (ACUTE ONLY): 17 min   Charges:   PT Evaluation $Initial PT Evaluation Tier I: 1 Procedure     PT G CodesBlondell Reveal Lutz 01/04/2015, 1:19 PM [phone]

## 2015-01-06 ENCOUNTER — Encounter: Payer: Self-pay | Admitting: Internal Medicine

## 2015-01-08 LAB — CULTURE, BLOOD (ROUTINE X 2)
Culture: NO GROWTH
Culture: NO GROWTH

## 2015-01-10 ENCOUNTER — Ambulatory Visit (INDEPENDENT_AMBULATORY_CARE_PROVIDER_SITE_OTHER): Payer: Medicare PPO | Admitting: *Deleted

## 2015-01-10 DIAGNOSIS — I639 Cerebral infarction, unspecified: Secondary | ICD-10-CM

## 2015-01-15 NOTE — Progress Notes (Signed)
Loop recorder 

## 2015-02-10 ENCOUNTER — Ambulatory Visit (INDEPENDENT_AMBULATORY_CARE_PROVIDER_SITE_OTHER): Payer: Medicare PPO | Admitting: *Deleted

## 2015-02-10 DIAGNOSIS — I639 Cerebral infarction, unspecified: Secondary | ICD-10-CM

## 2015-02-12 NOTE — Progress Notes (Signed)
Loop recorder 

## 2015-02-21 LAB — CUP PACEART REMOTE DEVICE CHECK: MDC IDC SESS DTM: 20160909123311

## 2015-02-21 NOTE — Progress Notes (Signed)
Carelink summary report received. Battery status OK. Normal device function. No new symptom episodes, tachy episodes, brady, or pause episodes. No new AF episodes. Monthly summary reports and ROV with AS in 11/2015.

## 2015-03-11 ENCOUNTER — Ambulatory Visit (INDEPENDENT_AMBULATORY_CARE_PROVIDER_SITE_OTHER): Payer: Medicare PPO | Admitting: *Deleted

## 2015-03-11 DIAGNOSIS — I639 Cerebral infarction, unspecified: Secondary | ICD-10-CM | POA: Diagnosis not present

## 2015-03-13 NOTE — Progress Notes (Signed)
Loop recorder 

## 2015-03-19 LAB — CUP PACEART REMOTE DEVICE CHECK: MDC IDC SESS DTM: 20160927173638

## 2015-03-19 NOTE — Progress Notes (Signed)
Carelink summary report received. Battery status OK. Normal device function. No new symptom episodes, tachy episodes, brady, or pause episodes. No new AF episodes. Monthly summary reports and ROV w/ AS 6/17.

## 2015-03-25 ENCOUNTER — Ambulatory Visit (INDEPENDENT_AMBULATORY_CARE_PROVIDER_SITE_OTHER): Payer: Medicare PPO | Admitting: Neurology

## 2015-03-25 ENCOUNTER — Encounter: Payer: Self-pay | Admitting: Neurology

## 2015-03-25 ENCOUNTER — Encounter: Payer: Self-pay | Admitting: Internal Medicine

## 2015-03-25 VITALS — BP 122/70 | HR 62 | Resp 20 | Ht 68.0 in | Wt 164.0 lb

## 2015-03-25 DIAGNOSIS — F0150 Vascular dementia without behavioral disturbance: Secondary | ICD-10-CM | POA: Diagnosis not present

## 2015-03-25 NOTE — Progress Notes (Signed)
Provider:  Larey Lutz, M D  Referring Provider: Lavone Orn, MD Primary Care Physician:  Trevor Shelling, MD  Chief Complaint  Patient presents with  . Follow-up    memory, denies "many" falls, rm 10, caregiver Trevor Lutz is present    HPI:  Trevor Lutz is a 81 y.o. male , who is seen here as a referral from Trevor Lutz , and used to be followed by Trevor Lutz . He has seen Trevor. Leonie Lutz .  He was last seen in 2013 and described as progressively demented with personality and behavior changes for over a decade. One Stroke in 2010 and one 2 years ago while at a wedding, treated at [facility], where his son is a Financial controller. He has been confused and gives often instructions to his group of full time caretakers that are not sensible. He is unaware of his memory loss, and his MMSE in august 2013 was 22 /30 , now 17/30. Short term memory has declined drastically.  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 The patient confirmed he had met Trevor Lutz- personally.  He has been stable overall , a slow dementia progression, and he has many resources of stimulation and physical care surrounding him.   Trevor Lutz is here for regular yearly revisit on 03-25-15, There have been no emergency admissions and no surgical or medical emergencies. Stance is followed for memory loss in our clinic but she scored rather well on the Mini-Mental Status Examination. He scored 18 points. He had some trouble with spelling a word backwards and could not remember any of the delayed recall words. But he was fully oriented to the place Trevor Lutz, Trevor Lutz, Trevor Lutz etc. he was not sure about the date. He reports reading the newspaper daily and is here with a sitter, he lives in is private home , shared with his wife, chair woman of the [employer].      Review of Systems: Out of a complete 14 system review, the patient complains of only the  following symptoms, and all other reviewed systems are negative. Dementia, repeatedly asking the same question. His caretakers report he is not drinking enough.   Social History   Social History  . Marital Status: Married    Spouse Name: N/A  . Number of Children: N/A  . Years of Education: N/A   Occupational History  . Not on file.   Social History Main Topics  . Smoking status: Former Smoker    Types: Cigarettes  . Smokeless tobacco: Never Used     Comment: QUIT SMOKING MANY YEARS AGO  . Alcohol Use: 1.2 oz/week    2 Glasses of wine per week     Comment: 2 glasses a week  . Drug Use: No  . Sexual Activity: No   Other Topics Concern  . Not on file   Social History Narrative    Family History  Problem Relation Age of Onset  . Heart disease Father   . Heart disease Mother     Past Medical History  Diagnosis Date  . Hypertension   . BPH (benign prostatic hyperplasia)     HAS INDWELLING FOLEY CATHETER  . Alzheimer disease   . Hyperlipidemia   . Erectile dysfunction   . Mild depression   . Spinal stenosis   . CVA (cerebral vascular accident) (Trevor Lutz) 2010    Left basal ganglia  . Acute CVA (  cerebrovascular accident) ([place]) 10/25/13    Left frontal lobe, left parietal lobe, left occipital lobe  . Separation of left acromioclavicular joint, type 3 2003  . Hemiparesis, right (Trevor Lutz) 11/07/2013  . Aphasia due to recent cerebral infarction 11/07/2013  . Hypertrophy of prostate with urinary obstruction and other lower urinary tract symptoms (LUTS) 11/07/2013  . Right inguinal hernia 09/26/2012    Past Surgical History  Procedure Laterality Date  . Kidney surgery      right kidney removed due  to traumatic injury  . Tonsilectomy, adenoidectomy, bilateral myringotomy and tubes    . Hernia repair Left 09/24/1996    Indirect, Trevor Lucia Gaskins surgeon  . Transurethral resection of prostate N/A 11/13/2012    Procedure: BI POLAR TRANSURETHRAL RESECTION OF THE PROSTATE (TURP);  Surgeon:  Fredricka Bonine, MD;  Location: [facility] ORS;  Service: Urology;  Laterality: N/A;  . Tee without cardioversion N/A 10/23/2013    Procedure: TRANSESOPHAGEAL ECHOCARDIOGRAM (TEE);  Surgeon: Thayer Headings, MD;  Location: [facility] END[facility];  Service: Cardiovascular;  Laterality: N/A;  . Loop recorder implant  10-24-2013    MDT LinQ implanted by Trevor Caryl Comes for cryptogenic stroke  . Orif acromioclavicular joint Left 2003  . Loop recorder implant N/A 10/24/2013    Procedure: LOOP RECORDER IMPLANT;  Surgeon: Deboraha Sprang, MD;  Location: [facility] CATH LAB;  Service: Cardiovascular;  Laterality: N/A;    Current Outpatient Prescriptions  Medication Sig Dispense Refill  . atorvastatin (LIPITOR) 10 MG tablet Take 10 mg by mouth daily.    . cholecalciferol (VITAMIN D) 1000 UNITS tablet Take 1,000 Units by mouth every morning.     . citalopram (CELEXA) 10 MG tablet Take 10 mg by mouth every morning.     . clopidogrel (PLAVIX) 75 MG tablet Take 75 mg by mouth daily with breakfast.    . divalproex (DEPAKOTE) 250 MG Trevor tablet Take 250 mg by mouth 2 (two) times daily.     Marland Kitchen galantamine (RAZADYNE) 12 MG tablet Take 12 mg by mouth 2 (two) times daily.    Marland Kitchen guaiFENesin-dextromethorphan (ROBITUSSIN DM) 100-10 MG/5ML syrup Take 10 mLs by mouth every 4 (four) hours as needed for cough.    Marland Kitchen l-methylfolate-b2-b6-b12 (CEREFOLIN) 11-12-48-5 MG TABS Take 1 tablet by mouth every morning.     Marland Kitchen levofloxacin (LEVAQUIN) 750 MG tablet Take 1 tablet (750 mg total) by mouth every other day. 2 tablet 0  . memantine (NAMENDA) 10 MG tablet Take 10 mg by mouth 2 (two) times daily.    . Multiple Vitamin (MULTIVITAMIN) tablet Take 1 tablet by mouth every morning.     . tamsulosin (FLOMAX) 0.4 MG CAPS Take 0.4 mg by mouth daily after breakfast.     . vitamin B-12 (CYANOCOBALAMIN) 100 MCG tablet Take 100 mcg by mouth every morning.      No current facility-administered medications for this visit.    Allergies as of 03/25/2015  . (No Known  Allergies)    Vitals: BP 122/70 mmHg  Pulse 62  Resp 20  Ht _0  (1.727 m)  Wt 164 lb (74.39 kg)  BMI 24.94 kg/m2 Last Weight:  Wt Readings from Last 1 Encounters:  03/25/15 164 lb (74.39 kg)   Last Height:   Ht Readings from Last 1 Encounters:  03/25/15 _1  (1.727 m)    Physical exam:  General: The patient is awake, alert and appears not in acute distress. The patient is well groomed. Head: Normocephalic, atraumatic. Neck is supple. Mallampati 3, neck  circumference: 16  Cardiovascular:  Regular rate and rhythm , without  murmurs or carotid bruit, and without distended neck veins. Respiratory: Lungs are clear to auscultation. Skin:  Without evidence of edema, or rash  Neurologic exam : The patient is awake and alert, disoriented to place and time.  Memory severely impaired -as are attention span & concentration ability.  He repetively asks the same questions. Speech is fluent with" small talk " content- cannot answer questions and  needs repeated instructions.  dysarthria, dysphonia or aphasia. Mood and affect are appropriate.  Cranial nerves: Pupils are equal and briskly reactive to light. Extraocular movements  in vertical and horizontal planes intact and with end point nystagmus. In the horizontal plane, most evident to the right.  Visual fields by finger perimetry are intact. Hearing to finger rub intact. Facial sensation intact to fine touch.  Facial motor strength is symmetric and tongue and uvula move midline. Tongue protrusion into either cheek is normal. Shoulder shrug is normal.   Motor exam:  The patient has good symmetric strength in upper and lower extremities, there is some gegenhalten, his tone is increased but it is not cog-wheeling. Sensory:  Fine touch, pinprick and vibration were tested in all extremities. Proprioception was normal.  Coordination: Rapid alternating movements in the fingers/hands were slowed. Finger-to-nose maneuver : dysmetria, no   tremor.  Gait and station: Patient walks without assistive device , with assistance able  to climb up to the exam table.  Strength within normal limits. Stance is stable and normal.   Deep tendon reflexes: in the upper and lower extremities are brisk . Babinski maneuver response is  downgoing.   Assessment:   25 minute RV with more tahn 50% of the face to face time dedicated to caregiver instruction and coordination of care.  After physical and neurologic examination, review of laboratory studies, imaging, neurophysiology testing and pre-existing records, assessment is that of :  Dementia with hallucinations, but not parkinsonian, not of Lewy Body Type , no parkinsonism.  Likely of vascular origin . He has been stable to a MMSE of 18-30 points. Last year , 2015 , it was 17 points.     Plan:  Treatment plan and additional workup : Continue current meds, encourage hydration.   Rv in 12 month with 30 minute visit with NP, MMSE     CC Trevor Laurann Montana, MD   Trevor Seat MD 03/25/2015       Past Surgical History  Procedure Laterality Date  . Kidney surgery      right kidney removed due  to traumatic injury  . Tonsilectomy, adenoidectomy, bilateral myringotomy and tubes    . Hernia repair Left 09/24/1996    Indirect, Trevor Lucia Gaskins surgeon  . Transurethral resection of prostate N/A 11/13/2012    Procedure: BI POLAR TRANSURETHRAL RESECTION OF THE PROSTATE (TURP);  Surgeon: Fredricka Bonine, MD;  Location: [facility] ORS;  Service: Urology;  Laterality: N/A;  . Tee without cardioversion N/A 10/23/2013    Procedure: TRANSESOPHAGEAL ECHOCARDIOGRAM (TEE);  Surgeon: Thayer Headings, MD;  Location: [facility];  Service: Cardiovascular;  Laterality: N/A;  . Loop recorder implant  10-24-2013    MDT LinQ implanted by Trevor Caryl Comes for cryptogenic stroke  . Orif acromioclavicular joint Left 2003  . Loop recorder implant N/A 10/24/2013    Procedure: LOOP RECORDER IMPLANT;  Surgeon: Deboraha Sprang, MD;   Location: [facility] CATH LAB;  Service: Cardiovascular;  Laterality: N/A;    Current Outpatient Prescriptions  Medication Sig  Dispense Refill  . atorvastatin (LIPITOR) 10 MG tablet Take 10 mg by mouth daily.    . cholecalciferol (VITAMIN D) 1000 UNITS tablet Take 1,000 Units by mouth every morning.     . citalopram (CELEXA) 10 MG tablet Take 10 mg by mouth every morning.     . clopidogrel (PLAVIX) 75 MG tablet Take 75 mg by mouth daily with breakfast.    . divalproex (DEPAKOTE) 250 MG Trevor tablet Take 250 mg by mouth 2 (two) times daily.     Marland Kitchen galantamine (RAZADYNE) 12 MG tablet Take 12 mg by mouth 2 (two) times daily.    Marland Kitchen guaiFENesin-dextromethorphan (ROBITUSSIN DM) 100-10 MG/5ML syrup Take 10 mLs by mouth every 4 (four) hours as needed for cough.    Marland Kitchen l-methylfolate-b2-b6-b12 (CEREFOLIN) 11-12-48-5 MG TABS Take 1 tablet by mouth every morning.     Marland Kitchen levofloxacin (LEVAQUIN) 750 MG tablet Take 1 tablet (750 mg total) by mouth every other day. 2 tablet 0  . memantine (NAMENDA) 10 MG tablet Take 10 mg by mouth 2 (two) times daily.    . Multiple Vitamin (MULTIVITAMIN) tablet Take 1 tablet by mouth every morning.     . tamsulosin (FLOMAX) 0.4 MG CAPS Take 0.4 mg by mouth daily after breakfast.     . vitamin B-12 (CYANOCOBALAMIN) 100 MCG tablet Take 100 mcg by mouth every morning.      No current facility-administered medications for this visit.    Allergies as of 03/25/2015  . (No Known Allergies)    Vitals: BP 122/70 mmHg  Pulse 62  Resp 20  Ht _0  (1.727 m)  Wt 164 lb (74.39 kg)  BMI 24.94 kg/m2 Last Weight:  Wt Readings from Last 1 Encounters:  03/25/15 164 lb (74.39 kg)   Last Height:   Ht Readings from Last 1 Encounters:  03/25/15 _1  (1.727 m)    Physical exam:  General: The patient is awake, alert and appears not in acute distress. The patient is well groomed. Head: Normocephalic, atraumatic. Neck is supple. Mallampati 3, neck circumference: 16  Cardiovascular:  Regular  rate and rhythm , without  murmurs or carotid bruit, and without distended neck veins. Respiratory: Lungs are clear to auscultation. Skin:  Without evidence of edema, or rash  Neurologic exam : The patient is awake and alert, disoriented to place and time.  Memory severely impaired -as are attention span & concentration ability. Speech is fluent with" small talk " content- cannot answer questions and  needs repeated instructions.  dysarthria, dysphonia or aphasia. Mood and affect are appropriate.  Cranial nerves: Pupils are equal and briskly reactive to light. Extraocular movements  in vertical and horizontal planes intact and with end point nystagmus. In the horizontal plane, most evident to the right.  Visual fields by finger perimetry are intact. Hearing to finger rub intact. Facial sensation intact to fine touch. Facial motor strength is symmetric and tongue and uvula move midline. Tongue protrusion into either cheek is normal. Shoulder shrug is normal.   Motor exam:  The patient has good symmetric strength in upper and lower extremities, there is some gegenhalten, his tone is increased but it is not cog-wheeling. Sensory:  Fine touch, pinprick and vibration were tested in all extremities. Proprioception was normal.  Coordination: Rapid alternating movements in the fingers/hands were slowed. Finger-to-nose maneuver : dysmetria, no  tremor.  Gait and station: Patient walks without assistive device , with assistance able  to climb up to the exam table.  Strength  within normal limits. Stance is stable and normal.   Deep tendon reflexes: in the upper and lower extremities are brisk . Babinski maneuver response is  downgoing.   Assessment:  After physical and neurologic examination, review of laboratory studies, imaging, neurophysiology testing and pre-existing records, assessment is that of :  Dementia with hallucinations, but not parkinsonian, not of Lewy Body Type ,  with vascular  contribution. He has declined to a MSSE of 17-30 points.     Plan:  Treatment plan and additional workup : Continue current meds, encourage hydration.  except DITROPAN as an anti-cholinerig medication. Rv in 12 month      Asencion Partridge Montague Corella MD 03/25/2015

## 2015-03-25 NOTE — Patient Instructions (Signed)
Vascular Dementia Vascular dementia is a common cause of dementia in the elderly. Dementia is a condition that affects the brain and causes people to not think well or act normally. Vascular dementia is one type of dementia. It occurs when blood clots block small blood vessels in the brain and destroy brain tissue. Likely risk factors are high blood pressure and advanced age. This disease can cause stroke, migraine-like headaches, and psychiatric disturbances.  SYMPTOMS   Confusion.  Problems with recent memory.  Wandering or getting lost in familiar places.  Loss of bladder or bowel control (incontinence).  Unsteady gait.  Poor attention and concentration.  Emotional problems such as laughing or crying inappropriately.  Difficulty following instructions.  Problems handling money.  Depression.  Difficulty planning ahead. Usually the damage is slight at first. Over time, as more small vessels are blocked, there is a gradual mental decline. However, symptoms may begin suddenly. Symptoms may be very similar to Alzheimer's disease. The two forms of dementia may occur together. Vascular dementia typically begins between the ages of 60 and 75. It affects men more often than women. TREATMENT   Currently there is no treatment for vascular dementia that can reverse the damage that has already occurred.  Treatment focuses on prevention of additional brain damage and improvement of symptoms.  It is important to treat the risk factors for vascular dementia, such as keeping blood pressure under control, treating diabetes, lowering cholesterol, and stop smoking. PROGNOSIS   Prognosis for patients is generally poor. Individuals with the disease may improve for short periods of time, then get worse again. Early treatment and management of blood pressure and other risk factors may prevent further worsening of the disorder.   This information is not intended to replace advice given to you by your  health care provider. Make sure you discuss any questions you have with your health care provider.   Document Released: 05/21/2002 Document Revised: 08/23/2011 Document Reviewed: 09/11/2014 Elsevier Interactive Patient Education 2016 [employer] Inc[employer]  

## 2015-04-02 ENCOUNTER — Telehealth: Payer: Self-pay | Admitting: Neurology

## 2015-04-02 ENCOUNTER — Encounter: Payer: Self-pay | Admitting: Internal Medicine

## 2015-04-02 NOTE — Telephone Encounter (Signed)
The patient's wife is calling stating the patient just fell and hit the back of his head. He is conscious and appears to be ok and not having any problems. The patient's wife had spoken first to a brother-in-law who is a urologist and he did not think it was necessary for the patient to go to the ER. The patient's wife is concerned because the patient is on a blood thinner. Please call and discuss. Thank you.

## 2015-04-02 NOTE — Telephone Encounter (Signed)
Spoke to Dr. Brett Fairy. She advised that if the pt is having changes in mentation, loss of consciousness, sudden sleepiness, he needs to go to ER. He should also follow up with his PCP or Urgent Care.  Spoke to pt's wife. She denies all of these symptoms, no LOC, no changes in mentation, no sleepiness, and says that she has already spoken two doctors in the family and they told her not to take him to the ER. She says that if he experiences any of these symptoms, she will take him to the ER. Pt's wife is adamant that the pt is fine and she has already spoken to two doctors in the family. I advised her to watch the patient closely for any changes, and she verbalized understanding.

## 2015-04-10 ENCOUNTER — Ambulatory Visit (INDEPENDENT_AMBULATORY_CARE_PROVIDER_SITE_OTHER): Payer: Medicare PPO | Admitting: *Deleted

## 2015-04-10 DIAGNOSIS — I639 Cerebral infarction, unspecified: Secondary | ICD-10-CM

## 2015-04-11 NOTE — Progress Notes (Signed)
Loop recorder 

## 2015-04-25 ENCOUNTER — Ambulatory Visit
Admission: RE | Admit: 2015-04-25 | Discharge: 2015-04-25 | Disposition: A | Discharge status: Home / Self Care | Payer: Medicare PPO | Source: Ambulatory Visit | Attending: Internal Medicine | Admitting: Internal Medicine

## 2015-04-25 ENCOUNTER — Other Ambulatory Visit: Payer: Self-pay | Admitting: Internal Medicine

## 2015-04-25 ENCOUNTER — Encounter: Payer: Self-pay | Admitting: Internal Medicine

## 2015-04-25 DIAGNOSIS — M7918 Myalgia, other site: Secondary | ICD-10-CM

## 2015-05-09 LAB — CUP PACEART REMOTE DEVICE CHECK: Date Time Interrogation Session: 20161027180834

## 2015-05-09 NOTE — Progress Notes (Signed)
Carelink summary report received. Battery status OK. Normal device function. No new symptom episodes, tachy episodes, brady, or pause episodes. No new AF episodes. Monthly summary reports and ROV with AS in 11/2015. 

## 2015-05-12 ENCOUNTER — Ambulatory Visit: Payer: Medicare PPO | Admitting: *Deleted

## 2015-06-10 ENCOUNTER — Ambulatory Visit (INDEPENDENT_AMBULATORY_CARE_PROVIDER_SITE_OTHER): Payer: Medicare PPO | Admitting: *Deleted

## 2015-06-10 DIAGNOSIS — I639 Cerebral infarction, unspecified: Secondary | ICD-10-CM

## 2015-06-10 NOTE — Progress Notes (Signed)
Carelink Summary Report / Loop Recorder 

## 2015-06-18 ENCOUNTER — Ambulatory Visit (INDEPENDENT_AMBULATORY_CARE_PROVIDER_SITE_OTHER): Payer: Medicare PPO | Admitting: Podiatry

## 2015-06-18 ENCOUNTER — Encounter: Payer: Self-pay | Admitting: Podiatry

## 2015-06-18 VITALS — BP 115/69 | HR 60 | Resp 12

## 2015-06-18 DIAGNOSIS — L600 Ingrowing nail: Secondary | ICD-10-CM

## 2015-06-18 NOTE — Patient Instructions (Signed)
Today I trimmed out the ingrowing margin of the right great toenail. There is no sign of pus or infection. Apply over-the-counter triple antibiotic ointment daily to that area on the right great toe nail and cover with a Band-Aid until sensitivity ends Return as needed

## 2015-06-18 NOTE — Progress Notes (Signed)
   Subjective:    Patient ID: Trevor Lutz, male    DOB: 07-07-1921, 82 y.o.   MRN: [id number]  HPI  This patient presents today with caregiver, Magda Paganini who states that she notices when she places socks on his right great toenail Mr. Allenbaugh seems to react is uncomfortable for the past 2 weeks. She also noticed that when he went to the pedicurist when the pedicurist attempted trim the right great toenail Mr. Scannell was reactive about the lateral margin right hallux nail. She denies noticing any drainage, redness in around the area.  Review of Systems  Musculoskeletal: Positive for gait problem.  Skin: Positive for color change.       Objective:   Physical Exam   pleasant, confused patient presents with caregiver Magda Paganini and treatment room  Vascular: No peripheral edema bilaterally DP and PT pulses 0/4 bilaterally Capillary reflex within normal limits  Neurological: Patient was able to respond to 10 g monofilament wire 3/5 right 4/5 left Ankle reflexes reactive bilaterally  Dermatological:  atrophic skin with generalized rubor right and left feet No open skin lesions bilaterally Lateral margin right hallux toenails incurvated and tender to pressure. There is no erythema, edema, drainage around the area  Musculoskeletal: Hammertoe second right Hammertoe 2-4 left There is no restriction ankle, subtalar, midtarsal joints bilaterally       Assessment & Plan:    assessment: Decrease pedal pulses without any open skin lesions Incurvated ingrowing lateral margin right hallux toenail without any clinical sign of infection  Plan: Today I made patient and Magda Paganini were effective margin appear to be incurvated. There is prepped with alcohol and the lateral margin the right hallux toenail was debrided without any bleeding. No evidence of purulent drainage noted. An antibiotic dressing applied to the area  Advised caregiver to apply Triple Antibiotic ointment and a Band-Aid daily to  the right hallux nail until tenderness resolved Made Magda Paganini aware of the fact that the nail would regrow and return as needed  Reappoint at patient's or patient's caregivers request

## 2015-07-09 ENCOUNTER — Ambulatory Visit (INDEPENDENT_AMBULATORY_CARE_PROVIDER_SITE_OTHER): Payer: Medicare PPO | Admitting: *Deleted

## 2015-07-09 DIAGNOSIS — I639 Cerebral infarction, unspecified: Secondary | ICD-10-CM | POA: Diagnosis not present

## 2015-07-09 NOTE — Progress Notes (Signed)
Carelink Summary Report / Loop Recorder 

## 2015-07-15 LAB — CUP PACEART REMOTE DEVICE CHECK: Date Time Interrogation Session: 20161126180703

## 2015-07-19 LAB — CUP PACEART REMOTE DEVICE CHECK: Date Time Interrogation Session: 20161226180923

## 2015-08-08 ENCOUNTER — Ambulatory Visit (INDEPENDENT_AMBULATORY_CARE_PROVIDER_SITE_OTHER): Payer: Medicare PPO | Admitting: *Deleted

## 2015-08-08 DIAGNOSIS — I639 Cerebral infarction, unspecified: Secondary | ICD-10-CM

## 2015-08-08 NOTE — Progress Notes (Signed)
Carelink Summary Report / Loop Recorder 

## 2015-08-19 LAB — CUP PACEART REMOTE DEVICE CHECK: MDC IDC SESS DTM: 20170125183918

## 2015-08-19 NOTE — Progress Notes (Signed)
Carelink summary report received. Battery status OK. Normal device function. No new symptom episodes, tachy episodes, brady, or pause episodes. No new AF episodes. Monthly summary reports and ROV/PRN 

## 2015-08-25 LAB — CUP PACEART REMOTE DEVICE CHECK: MDC IDC SESS DTM: 20170224190602

## 2015-08-25 NOTE — Progress Notes (Signed)
Carelink summary report received. Battery status OK. Normal device function. No new symptom episodes, tachy episodes, brady, or pause episodes. No new AF episodes. Monthly summary reports and ROV/PRN 

## 2015-09-08 ENCOUNTER — Ambulatory Visit (INDEPENDENT_AMBULATORY_CARE_PROVIDER_SITE_OTHER): Payer: Medicare PPO | Admitting: *Deleted

## 2015-09-08 DIAGNOSIS — I639 Cerebral infarction, unspecified: Secondary | ICD-10-CM

## 2015-09-08 NOTE — Progress Notes (Signed)
Carelink Summary Report / Loop Recorder 

## 2015-10-07 ENCOUNTER — Ambulatory Visit (INDEPENDENT_AMBULATORY_CARE_PROVIDER_SITE_OTHER): Payer: Medicare PPO | Admitting: *Deleted

## 2015-10-07 DIAGNOSIS — I639 Cerebral infarction, unspecified: Secondary | ICD-10-CM

## 2015-10-08 NOTE — Progress Notes (Signed)
Carelink Summary Report / Loop Recorder 

## 2015-11-06 ENCOUNTER — Ambulatory Visit (INDEPENDENT_AMBULATORY_CARE_PROVIDER_SITE_OTHER): Payer: Medicare PPO | Admitting: *Deleted

## 2015-11-06 DIAGNOSIS — I639 Cerebral infarction, unspecified: Secondary | ICD-10-CM

## 2015-11-07 NOTE — Progress Notes (Signed)
Carelink Summary Report / Loop Recorder 

## 2015-11-08 LAB — CUP PACEART REMOTE DEVICE CHECK: Date Time Interrogation Session: 20170326190947

## 2015-11-08 NOTE — Progress Notes (Signed)
Carelink summary report received. Battery status OK. Normal device function. No new symptom episodes, tachy episodes, brady, or pause episodes. No new AF episodes. Monthly summary reports and ROV/PRN 

## 2015-11-10 LAB — CUP PACEART REMOTE DEVICE CHECK: MDC IDC SESS DTM: 20170425193842

## 2015-11-10 NOTE — Progress Notes (Signed)
Carelink summary report received. Battery status OK. Normal device function. No new symptom episodes, tachy episodes, brady, or pause episodes. No new AF episodes. Monthly summary reports and ROV/PRN 

## 2015-11-19 ENCOUNTER — Ambulatory Visit (INDEPENDENT_AMBULATORY_CARE_PROVIDER_SITE_OTHER): Payer: Medicare PPO | Admitting: Podiatry

## 2015-11-19 ENCOUNTER — Encounter: Payer: Self-pay | Admitting: Podiatry

## 2015-11-19 VITALS — BP 133/78 | HR 65 | Resp 16

## 2015-11-19 DIAGNOSIS — L03031 Cellulitis of right toe: Secondary | ICD-10-CM | POA: Diagnosis not present

## 2015-11-19 DIAGNOSIS — L600 Ingrowing nail: Secondary | ICD-10-CM | POA: Diagnosis not present

## 2015-11-19 DIAGNOSIS — L03011 Cellulitis of right finger: Secondary | ICD-10-CM

## 2015-11-19 NOTE — Progress Notes (Signed)
Subjective:     Patient ID: Trevor Lutz, male   DOB: 1921-08-06, 82 y.o.   MRN: [id number]  HPI patient presents stating that he has a painful nail on his right big toe with redness but no drainage and presents with caregiver today   Review of Systems     Objective:   Physical Exam Neurovascular status unchanged from previous visit with patient found to have an incurvated right hallux lateral border that red and irritated on the tip with mild localized drainage    Assessment:     Localized paronychia infection right lateral border with pain    Plan:     H&P and condition reviewed and recommended removal of the corner. Explained procedure and risk and today I infiltrated the right hallux 60 mg Xylocaine Marcaine mixture with sterile application to the digit. I then remove the lateral border proud flesh created channel for drainage and applied sterile dressing and instructed on soaks to him and caregiver. Reappoint to recheck

## 2015-11-19 NOTE — Patient Instructions (Addendum)

## 2015-12-05 ENCOUNTER — Telehealth: Payer: Self-pay | Admitting: Cardiology

## 2015-12-05 NOTE — Telephone Encounter (Signed)
Spoke w/ pt caregiver to request a manual transmission b/c pt home monitor has not updated in at least 14 days. Pt caregiver verbalized understanding.

## 2015-12-08 ENCOUNTER — Ambulatory Visit (INDEPENDENT_AMBULATORY_CARE_PROVIDER_SITE_OTHER): Payer: Medicare PPO | Admitting: *Deleted

## 2015-12-08 DIAGNOSIS — I639 Cerebral infarction, unspecified: Secondary | ICD-10-CM | POA: Diagnosis not present

## 2015-12-08 NOTE — Progress Notes (Signed)
Carelink Summary Report / Loop Recorder 

## 2015-12-11 ENCOUNTER — Encounter: Payer: Medicare PPO | Admitting: Nurse Practitioner

## 2015-12-16 LAB — CUP PACEART REMOTE DEVICE CHECK: MDC IDC SESS DTM: 20170525193715

## 2015-12-16 NOTE — Progress Notes (Signed)
Carelink summary report received. Battery status OK. Normal device function. No new symptom episodes, tachy episodes, brady, or pause episodes. No new AF episodes. Monthly summary reports and ROV/PRN 

## 2015-12-18 ENCOUNTER — Other Ambulatory Visit: Payer: Self-pay | Admitting: Gastroenterology

## 2015-12-18 DIAGNOSIS — C189 Malignant neoplasm of colon, unspecified: Secondary | ICD-10-CM

## 2015-12-18 HISTORY — DX: Malignant neoplasm of colon, unspecified: C18.9

## 2015-12-23 ENCOUNTER — Other Ambulatory Visit: Payer: Self-pay | Admitting: Gastroenterology

## 2015-12-23 ENCOUNTER — Other Ambulatory Visit: Payer: Self-pay | Admitting: Radiation Oncology

## 2015-12-23 DIAGNOSIS — C187 Malignant neoplasm of sigmoid colon: Secondary | ICD-10-CM

## 2015-12-26 ENCOUNTER — Telehealth: Payer: Self-pay | Admitting: Cardiology

## 2015-12-26 LAB — CUP PACEART REMOTE DEVICE CHECK: MDC IDC SESS DTM: 20170624201006

## 2015-12-26 NOTE — Telephone Encounter (Signed)
Attempted to call pt and request a manual transmission b/c his home monitor has not updated in at least 14 days. No answer and unable to leave a message.

## 2015-12-29 ENCOUNTER — Ambulatory Visit
Admission: RE | Admit: 2015-12-29 | Discharge: 2015-12-29 | Disposition: A | Discharge status: Home / Self Care | Payer: Medicare PPO | Source: Ambulatory Visit | Attending: Gastroenterology | Admitting: Gastroenterology

## 2015-12-29 DIAGNOSIS — C187 Malignant neoplasm of sigmoid colon: Secondary | ICD-10-CM

## 2015-12-29 MED ORDER — IOPAMIDOL (ISOVUE-300) INJECTION 61%
100.0000 mL | Freq: Once | INTRAVENOUS | Status: AC | PRN
  Administered 2015-12-29: 100 mL via INTRAVENOUS

## 2016-01-01 ENCOUNTER — Ambulatory Visit (HOSPITAL_BASED_OUTPATIENT_CLINIC_OR_DEPARTMENT_OTHER): Payer: Medicare PPO | Admitting: Oncology

## 2016-01-01 ENCOUNTER — Encounter: Payer: Self-pay | Admitting: *Deleted

## 2016-01-01 ENCOUNTER — Encounter: Payer: Self-pay | Admitting: Radiation Oncology

## 2016-01-01 ENCOUNTER — Ambulatory Visit
Admission: RE | Admit: 2016-01-01 | Discharge: 2016-01-01 | Disposition: A | Discharge status: Home / Self Care | Payer: Medicare PPO | Source: Ambulatory Visit | Attending: Radiation Oncology | Admitting: Radiation Oncology

## 2016-01-01 ENCOUNTER — Encounter: Payer: Self-pay | Admitting: Cardiology

## 2016-01-01 VITALS — BP 119/53 | HR 65 | Temp 97.6°F | Resp 18 | Ht 68.0 in | Wt 151.5 lb

## 2016-01-01 DIAGNOSIS — C20 Malignant neoplasm of rectum: Secondary | ICD-10-CM | POA: Diagnosis present

## 2016-01-01 DIAGNOSIS — Z510 Encounter for antineoplastic radiation therapy: Secondary | ICD-10-CM | POA: Insufficient documentation

## 2016-01-01 DIAGNOSIS — Z8673 Personal history of transient ischemic attack (TIA), and cerebral infarction without residual deficits: Secondary | ICD-10-CM | POA: Diagnosis not present

## 2016-01-01 DIAGNOSIS — F0390 Unspecified dementia without behavioral disturbance: Secondary | ICD-10-CM | POA: Diagnosis not present

## 2016-01-01 HISTORY — DX: Malignant neoplasm of colon, unspecified: C18.9

## 2016-01-01 MED ORDER — ONDANSETRON HCL 8 MG PO TABS: 8.0000 mg | ORAL_TABLET | Freq: Three times a day (TID) | ORAL | Status: DC | PRN

## 2016-01-01 NOTE — Progress Notes (Signed)
Radiation Oncology         [phone]) [phone] ________________________________  Name: Trevor Lutz MRN: [id number]  Date: 01/01/2016  DOB: 1922-04-23  [id number] Trevor Ester Mabe, MD  Clarene Essex, MD     REFERRING PHYSICIAN: Clarene Essex, MD   DIAGNOSIS: The encounter diagnosis was Rectal cancer [facility]).   HISTORY OF PRESENT ILLNESS::Trevor Lutz is a 82 y.o. male who is seen for an initial consultation visit regarding the patient's diagnosis of rectal cancer.  The patient has dementia and the history was unavailable from his wife. He was recently evaluated for rectal bleeding. This has occurred over the last month. No indication of pain associated with this. The patient underwent a sigmoidoscopy on 12/18/2015 area a partially obstructing mass was found in the rectosigmoid colon and bruising/bleeding was present. Biopsies were taken and this revealed a poorly differentiated carcinoma. This was felt to be most consistent with squamous cell carcinoma.  A CT scan of the abdomen and pelvis was obtained on 12/29/2015. This revealed a distal colon obstruction secondary to mass in the sigmoid colon/upper rectum. The mass extends into the presacral soft tissues and along the left lateral pelvic sidewall. Lymph nodes including an 11 mm lymph node were noted.  The patient's family has discussed possible treatment options with various physicians.  The goal of care at this time is to try to shrink the tumor to alleviate or delay local symptoms including bowel obstruction. Improvement in bleeding also is a goal of treatment. In considering different options, the patient's family does not wish to proceed with chemotherapy. They are however interested in a potential short course of palliative radiation treatment.    PREVIOUS RADIATION THERAPY: No   PAST MEDICAL HISTORY:  has a past medical history of Hypertension; BPH (benign prostatic hyperplasia); Alzheimer disease; Hyperlipidemia; Erectile dysfunction; Mild  depression; Spinal stenosis; Separation of left acromioclavicular joint, type 3 (2003); Hemiparesis, right ([place]) (11/07/2013); Aphasia due to recent cerebral infarction (11/07/2013); Hypertrophy of prostate with urinary obstruction and other lower urinary tract symptoms (LUTS) (11/07/2013); Right inguinal hernia (09/26/2012); Colon cancer ([place]) (12/18/15); CVA (cerebral vascular accident) ([place]) (2010); and Acute CVA (cerebrovascular accident) ([place]) (10/25/13).     PAST SURGICAL HISTORY: Past Surgical History  Procedure Laterality Date  . Kidney surgery      right kidney removed due  to traumatic injury  . Tonsilectomy, adenoidectomy, bilateral myringotomy and tubes    . Hernia repair Left 09/24/1996    Indirect, Dr Lucia Gaskins surgeon  . Transurethral resection of prostate N/A 11/13/2012    Procedure: BI POLAR TRANSURETHRAL RESECTION OF THE PROSTATE (TURP);  Surgeon: Fredricka Bonine, MD;  Location: [facility] ORS;  Service: Urology;  Laterality: N/A;  . Tee without cardioversion N/A 10/23/2013    Procedure: TRANSESOPHAGEAL ECHOCARDIOGRAM (TEE);  Surgeon: Thayer Headings, MD;  Location: [facility] ENDOSCOPY;  Service: Cardiovascular;  Laterality: N/A;  . Loop recorder implant  10-24-2013    MDT LinQ implanted by Dr Caryl Comes for cryptogenic stroke  . Orif acromioclavicular joint Left 2003  . Loop recorder implant N/A 10/24/2013    Procedure: LOOP RECORDER IMPLANT;  Surgeon: Deboraha Sprang, MD;  Location: [facility] CATH LAB;  Service: Cardiovascular;  Laterality: N/A;     FAMILY HISTORY: family history includes Heart disease in his father and mother.   SOCIAL HISTORY:  reports that he has quit smoking. His smoking use included Cigarettes. He has never used smokeless tobacco. He reports that he drinks about 1.2 oz of alcohol per week. He reports that  he does not use illicit drugs.   ALLERGIES: Review of patient's allergies indicates no known allergies.   MEDICATIONS:  Current Outpatient Prescriptions  Medication Sig  Dispense Refill  . atorvastatin (LIPITOR) 10 MG tablet Take 10 mg by mouth daily.    . cholecalciferol (VITAMIN D) 1000 UNITS tablet Take 1,000 Units by mouth every morning.     . citalopram (CELEXA) 10 MG tablet Take 10 mg by mouth every morning.     . clopidogrel (PLAVIX) 75 MG tablet Take 75 mg by mouth daily with breakfast.    . divalproex (DEPAKOTE) 250 MG DR tablet Take 250 mg by mouth 2 (two) times daily.     Marland Kitchen galantamine (RAZADYNE) 12 MG tablet Take 12 mg by mouth 2 (two) times daily.    Marland Kitchen guaiFENesin-dextromethorphan (ROBITUSSIN DM) 100-10 MG/5ML syrup Take 10 mLs by mouth every 4 (four) hours as needed for cough.    Marland Kitchen l-methylfolate-b2-b6-b12 (CEREFOLIN) 11-12-48-5 MG TABS Take 1 tablet by mouth every morning.     Marland Kitchen levofloxacin (LEVAQUIN) 750 MG tablet Take 1 tablet (750 mg total) by mouth every other day. 2 tablet 0  . memantine (NAMENDA) 10 MG tablet Take 10 mg by mouth 2 (two) times daily.    . Multiple Vitamin (MULTIVITAMIN) tablet Take 1 tablet by mouth every morning.     . tamsulosin (FLOMAX) 0.4 MG CAPS Take 0.4 mg by mouth daily after breakfast.     . vitamin B-12 (CYANOCOBALAMIN) 100 MCG tablet Take 100 mcg by mouth every morning.     . ondansetron (ZOFRAN) 8 MG tablet Take 1 tablet (8 mg total) by mouth every 8 (eight) hours as needed for nausea or vomiting. 30 tablet 1   No current facility-administered medications for this encounter.     REVIEW OF SYSTEMS:  A 15 point review of systems is documented in the electronic medical record. This was obtained by the nursing staff. However, I reviewed this with the patient to discuss relevant findings and make appropriate changes.  Pertinent items are noted in HPI.    PHYSICAL EXAM:  vitals were not taken for this visit.  ECOG = 2  0 - Asymptomatic (Fully active, able to carry on all predisease activities without restriction)  1 - Symptomatic but completely ambulatory (Restricted in physically strenuous activity but ambulatory  and able to carry out work of a light or sedentary nature. For example, light housework, office work)  2 - Symptomatic, <50% in bed during the day (Ambulatory and capable of all self care but unable to carry out any work activities. Up and about more than 50% of waking hours)  3 - Symptomatic, >50% in bed, but not bedbound (Capable of only limited self-care, confined to bed or chair 50% or more of waking hours)  4 - Bedbound (Completely disabled. Cannot carry on any self-care. Totally confined to bed or chair)  5 - Death   Eustace Pen MM, Creech RH, Tormey DC, et al. [id number]). "Toxicity and response criteria of the [facility]". [place] Oncol. 5 (6): 649-55     LABORATORY DATA:  Lab Results  Component Value Date   WBC 12.9* 01/04/2015   HGB 13.8 01/04/2015   HCT 41.5 01/04/2015   MCV 92.2 01/04/2015   PLT 127* 01/04/2015   Lab Results  Component Value Date   NA 137 01/03/2015   K 4.4 01/03/2015   CL 104 01/03/2015   CO2 26 01/03/2015   Lab Results  Component Value  Date   ALT 16* 01/02/2015   AST 25 01/02/2015   ALKPHOS 52 01/02/2015   BILITOT 0.6 01/02/2015      RADIOGRAPHY: Ct Abdomen Pelvis W Contrast  12/29/2015  CLINICAL DATA:  Newly diagnosed sigmoid colon carcinoma on colonoscopy. Previous right nephrectomy to trauma. EXAM: CT ABDOMEN AND PELVIS WITH CONTRAST TECHNIQUE: Multidetector CT imaging of the abdomen and pelvis was performed using the standard protocol following bolus administration of intravenous contrast. CONTRAST:  112mL ISOVUE-300 IOPAMIDOL (ISOVUE-300) INJECTION 61% COMPARISON:  None. FINDINGS: Lower Chest: No acute findings. Hepatobiliary: Tiny sub-cm cyst seen segment 4A of the left hepatic lobe on image 8. No liver masses identified. Several small calcified gallstones seen, without evidence cholecystitis or biliary ductal dilatation. Pancreas:  No mass or inflammatory changes. Spleen:  Unremarkable. Adrenals/Urinary Tract: No adrenal  masses identified. Prior right nephrectomy noted. Small simple appearing cyst seen in lower pole of left kidney. No evidence of left renal masses or hydronephrosis. Urinary bladder is nearly completely empty but unremarkable in appearance. Stomach/Bowel: No evidence of small bowel obstruction. Distal colonic obstruction is seen due to a mass in the distal sigmoid colon measuring approximately 6.2 x 5.4 x 4.7 cm. This mass shows trans-serosal extension posteriorly into the presacral soft tissues and along the left lateral margin into the pelvic side wall soft tissues. There is also tumor extension inferiorly within right lateral wall into the upper rectum. Vascular/Lymphatic: Few pericolonic lymph nodes are seen, largest measuring 11 mm on 78/series 2. No other pathologically enlarged lymph nodes identified within the pelvis or abdomen. Aortic atherosclerosis noted. Reproductive: Mildly enlarged prostate gland. Symmetric seminal vesicles. Other: Moderate size right inguinal hernia is seen containing several small bowel loops. No evidence of small bowel obstruction or ischemia. Musculoskeletal:  No suspicious bone lesions identified. IMPRESSION: Colonic obstruction due to 6 cm mass in distal sigmoid colon, which shows trans-serosal extension into the presacral and left lateral pelvic side wall soft tissues. Mild pericolonic lymphadenopathy, consistent with local lymph node metastases. No evidence of metastatic disease within the upper pelvis or abdomen. Moderate right inguinal hernia containing small bowel. No evidence of small bowel obstruction or ischemia. Aortic atherosclerosis noted. Cholelithiasis.  No radiographic evidence of cholecystitis. Previous right nephrectomy.  Mildly enlarged prostate. Electronically Signed   By: Earle Gell M.D.   On: 12/29/2015 16:14       IMPRESSION:  I believe the patient is appropriate for a short course of palliative radiation treatment to the pelvis to try to address local  areas associated with the rectosigmoid mass. Interestingly, this was felt to represent a squamous cell carcinoma versus adenocarcinoma which would be unusual for this location. However, as I discussed with the patient's wife, the treatment would essentially be the same. He may however experience an increased response to treatment if the tumor is indeed a squamous cell carcinoma. She is interested in a short 5 fraction course of palliative radiation treatment to help with local symptoms. This appears very reasonable and therefore we discussed this potential treatment. She would like to proceed with treatment as soon as possible.   PLAN: The patient will undergo a simulation tomorrow such that we can proceed with treatment planning. We will plan to begin his treatment tomorrow as well. The patient does have dementia and we will need to see how he does with simulation and treatment itself. I believe after seeing him today and how he has done with imaging thus far, that it would be reasonable not to premedicate  him but rather see how he does with simulation. Certainly we can get him some medicine if necessary. I discussed possible side effects and the most likely significant side effects with the nausea. I have gone ahead and called in a prescription for Zofran to be available if needed.    ________________________________   Jodelle Gross, MD, PhD   **Disclaimer: This note was dictated with voice recognition software. Similar sounding words can inadvertently be transcribed and this note may contain transcription errors which may not have been corrected upon publication of note.**

## 2016-01-01 NOTE — Patient Instructions (Signed)
    Care Plan Summary- 01/01/2016 Name: Trevor Lutz         DOB: 11/14/1931   Your Medical Team: Medical Oncologist:  Dr. Ma Rings Radiation Oncologist:   Surgeon:    Dr. Kyung Rudd Type of Cancer: Squamous Cell Cancer of Rectum  Stage/Grade: undetermined *Exact staging of your cancer is based on size of the tumor, depth of invasion, involvement of lymph nodes or not, and whether or not the cancer has spread beyond the primary site.   Recommendations: Based on information available as of today's consult. Recommendations may change depending on the results of further tests or exams. 1) Best supportive care vs. attenuated course of radiation with or without low dose Xeloda 2)  3)  Next Steps: 1) See Dr. Lisbeth Renshaw as scheduled today and make aware of sedation need for RT 2) Will present case at 7/26 tumor board discussion 3) Dr. Benay Spice will confirm pathology with GI pathologist and discuss exact location of mass with Dr. Watt Climes. Questions? Merceda Elks, RN, BSN at [phone]. Manuela Schwartz is your Oncology Nurse Navigator and is available to assist you while you're receiving your medical care at [facility].

## 2016-01-01 NOTE — Progress Notes (Signed)
Oncology Nurse Navigator Documentation  Oncology Nurse Navigator Flowsheets 01/01/2016  Navigator Location [facility]Med Onc  Navigator Encounter Type Initial MedOnc  Abnormal Finding Date 12/19/2015  Confirmed Diagnosis Date 12/19/2015  Patient Visit Type MedOnc;Initial  Treatment Phase Pre-Tx/Tx Discussion  Barriers/Navigation Needs Education  Education Understanding Cancer/ Treatment Options;Newly Diagnosed Cancer Education;Pain/ Symptom Management  Interventions Education Method  Education Method Verbal;Written;Teach-back  Acuity Level 1  Time Spent with Patient 60  Met with patient and wife,Bonnie along with caregiver during new patient visit. Explained the role of the GI Nurse Navigator and provided New Patient Packet with information on: 1. Colorectal cancer 2. Fall Safety Plan Answered questions, reviewed current treatment plan using TEACH back and provided emotional support. Provided copy of current treatment plan. 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 He has significant dementia-still able to feed himself, but unable to dress himself or ambulate without assistance. Having rectal bleeding, but no pain. Eating well. Wife wishes to pursue comfort/supportive care--possible attenuated course of RT (does not want chemo), but he will require sedation to lie on table for tx. Will return to Dr. Benay Spice on a prn basis. Escorted patient and his wife to radiation oncology to meet with Dr. Lisbeth Renshaw. Will add patient to GI Conference on 01/07/16 per Dr. Benay Spice request.  Merceda Elks, RN, BSN GI Oncology [place]

## 2016-01-01 NOTE — Progress Notes (Signed)
[facility] Patient Consult   Referring MD: [facility]  Trevor Lutz 82 y.o.  09-18-1921    Reason for Referral: Rectal cancer   HPI: Trevor Lutz has severe dementia. He is accompanied by Trevor Lutz and a caretaker. The medical history is from the available records and Trevor Lutz. Trevor Lutz was referred to Dr. Watt Climes for evaluation of rectal bleeding. Trevor Lutz reports he has rectal bleeding and "clots "with each bowel movement. This has occurred over the past month. He has no other complaint. He was taken to a sigmoidoscopy on 12/18/2015. A polypoid partially obstructing mass was found at the rectosigmoid colon. Oozing was present. Biopsies were taken.. The pathology [phone]) revealed poorly differentiated carcinoma assistant with squamous cell carcinoma.  Trevor Lutz is referred to consider treatment options. Trevor Lutz indicates the family has made a decision not to pursue aggressive intervention such as surgery. They would like to palliate the bleeding and delay obstruction if possible.  A CT of the abdomen and pelvis on 12/29/2015 revealed distal colon obstruction secondary to a mass in the distal sigmoid colon. The mass extends into the presacral soft tissues and along the left lateral pelvic sidewall. Pericolonic lymph nodes including 11 mm node. Right inguinal hernia.  Past Medical History  Diagnosis Date  . Hypertension   . BPH (benign prostatic hyperplasia)       . Alzheimer disease   . Hyperlipidemia   . Erectile dysfunction   . Mild depression   . Spinal stenosis   . Separation of left acromioclavicular joint, type 3 2003  . History of Hemiparesis, right ([place]) 11/07/2013  . Aphasia due to cerebral infarction 11/07/2013  . Hypertrophy of prostate with urinary obstruction and other lower urinary tract symptoms (LUTS) 11/07/2013  . Right inguinal hernia 09/26/2012  . Rectal cancer  12/18/15    rectal poorly diff carcinoam  . CVA (cerebral vascular  accident) ([place]) 2010    Left basal ganglia  . Acute CVA (cerebrovascular accident) ([place]) 10/25/13    Left frontal lobe, left parietal lobe, left occipital lobe    Past Surgical History  Procedure Laterality Date  . Kidney surgery      right kidney removed due  to traumatic injury  . Tonsilectomy, adenoidectomy, bilateral myringotomy and tubes    . Hernia repair Left 09/24/1996    Indirect, Dr Lucia Gaskins surgeon  . Transurethral resection of prostate N/A 11/13/2012    Procedure: BI POLAR TRANSURETHRAL RESECTION OF THE PROSTATE (TURP);  Surgeon: Fredricka Bonine, MD;  Location: [facility] ORS;  Service: Urology;  Laterality: N/A;  . Tee without cardioversion N/A 10/23/2013    Procedure: TRANSESOPHAGEAL ECHOCARDIOGRAM (TEE);  Surgeon: Thayer Headings, MD;  Location: [facility] ENDOSCOPY;  Service: Cardiovascular;  Laterality: N/A;  . Loop recorder implant  10-24-2013    MDT LinQ implanted by Dr Caryl Comes for cryptogenic stroke  . Orif acromioclavicular joint Left 2003  . Loop recorder implant N/A 10/24/2013    Procedure: LOOP RECORDER IMPLANT;  Surgeon: Deboraha Sprang, MD;  Location[facility] CATH LAB;  Service: Cardiovascular;  Laterality: N/A;    Medications: Reviewed  Allergies: No Known Allergies  Family history: No specific family history of cancer noted per Trevor Lutz  Social History:   He lives in [place] with Trevor Lutz. He is retired longer. He does not use cigarettes. He has a history of heavy alcohol use. He was transfused at the time of a traumatic nephrectomy. No risk factor for HIV.  He had "hepatitis ". He was in the [employer]. Trevor Lutz has 24-hour caretakers at home. He is dependent on Trevor Lutz in caretakers for all of Trevor care.    ROS:   Positives include: Rectal bleeding  A complete ROS was otherwise negative.  Physical Exam:  Blood pressure 119/53, pulse 65, temperature 97.6 F (36.4 C), temperature source Oral, resp. rate 18, height 5\' 8"  (1.727 m), weight 151 lb 8 oz (68.72 kg), SpO2 100  %.  HEENT: Oropharynx without visible mass, neck without mass Lungs: Clear bilaterally Cardiac: Regular rate and rhythm Abdomen: No hepatomegaly, no mass, right inguinal hernia GU: Uncircumcised male, testes without mass  Vascular: No leg edema Lymph nodes: No cervical, supraclavicular, axillary, or inguinal nodes Neurologic: Alert, follows commands, was able to ambulate to the exam table with assistance. The motor exam appears intact in the upper and lower extremities. Skin: Benign appearing moles over the trunk and scalp   LAB:  CBC  Lab Results  Component Value Date   WBC 12.9* 01/04/2015   HGB 13.8 01/04/2015   HCT 41.5 01/04/2015   MCV 92.2 01/04/2015   PLT 127* 01/04/2015   NEUTROABS 6.1 01/02/2015     CMP      Component Value Date/Time   NA 137 01/03/2015 0144   K 4.4 01/03/2015 0144   CL 104 01/03/2015 0144   CO2 26 01/03/2015 0144   GLUCOSE 131* 01/03/2015 0144   BUN 18 01/03/2015 0144   CREATININE 1.11 01/03/2015 0144   CALCIUM 8.0* 01/03/2015 0144   PROT 7.3 01/02/2015 1935   ALBUMIN 4.0 01/02/2015 1935   AST 25 01/02/2015 1935   ALT 16* 01/02/2015 1935   ALKPHOS 52 01/02/2015 1935   BILITOT 0.6 01/02/2015 1935   GFRNONAA 56* 01/03/2015 0144   GFRAA >60 01/03/2015 0144    No results found for: CEA1  Imaging: CT images 12/29/2015-reviewed   Assessment/Plan:   1. Rectal cancer-proximal rectal versus rectosigmoid mass noted on flex by sigmoidoscopy 12/18/2015, biopsy confirmed poorly differential adenocarcinoma consistent with squamous cell carcinoma and the left pelvic sidewall  CT abdomen/pelvis 12/29/2015-distal sigmoid colon mass extending into presacral soft tissues  2.  Severe dementia  3.  Status post right nephrectomy  4.  BPH  5.  History of CVAs    Disposition:   Trevor Lutz is been diagnosed with a locally advanced carcinoma of the high rectum. I discussed the case with Dr. Watt Climes and he feels the tumor is in the high rectum as  opposed to the distal sigmoid colon.  We discussed the unusual nature of the squamous cell histology for a tumor in the high rectum.  I discussed treatment options with Trevor Lutz. She indicated the family has made a decision to not have surgical intervention. They would like to palliate the bleeding and obstruction if possible. They do not wish to consider chemotherapy.  Mr. Shelburn will see Dr. Lisbeth Renshaw later today to discuss palliative radiation. Trevor Lutz states that he would need to be sedated in order to complete radiation treatment.  A palliative stent could also be considered.  I will present Trevor case at the GI tumor conference on 01/07/2016. We will contact Trevor Lutz after the conference with the group recommendation.  We discussed a supportive care approach if he is unable to complete palliative radiation. Trevor Lutz does not wish to consult Hospice at present. Discontinuing Plavix may help reduce the rectal bleeding.  Mr. Cerda is not scheduled for a follow-up appointment at  the [facility]. I am available to see him in the future as needed.  Betsy Coder, MD  01/01/2016, 5:11 PM

## 2016-01-01 NOTE — Progress Notes (Addendum)
.  GI Location of Tumor / Histology: Rectum-Sigmoid colon cancer  Trevor Lutz presented  months ago with symptoms of: rectal bleeding  Biopsies of  (if applicable) revealed:Diagnosis 12/18/15 Rectum, biopsy, mass - POORLY DIFFERENTIATED CARCINOMA, SEE COMMENT.  Past/Anticipated interventions by surgeon, if any:   Past/Anticipated interventions by medical oncology, if any: appt with Dr. Benay Spice today at 2pm  Weight changes, if any: 10lb  Weight loss  Bowel/Bladder complaints, if any: several episodes hematochezia, bladder normal  Nausea / Vomiting, if any: No  Pain issues, if any:No  Any blood per rectum:  Yes   SAFETY ISSUES:  Prior radiation?  NO  Pacemaker/ICD? Yes  Is the patient on methotrexate? NO  Current Complaints/Details Attorney,  : Alzheimer's/vascular dementia 2 CVA'S, right nephrectomy, depression,essential tremor, HTN,former smoker cigarettes  10-19 daily,  quit 1972,wine   1 glass 5x week,

## 2016-01-02 ENCOUNTER — Ambulatory Visit
Admission: RE | Admit: 2016-01-02 | Discharge: 2016-01-02 | Disposition: A | Discharge status: Home / Self Care | Payer: Medicare PPO | Source: Ambulatory Visit | Attending: Radiation Oncology | Admitting: Radiation Oncology

## 2016-01-02 ENCOUNTER — Other Ambulatory Visit: Payer: Self-pay | Admitting: Radiation Oncology

## 2016-01-02 ENCOUNTER — Telehealth: Payer: Self-pay | Admitting: Radiation Oncology

## 2016-01-02 DIAGNOSIS — Z510 Encounter for antineoplastic radiation therapy: Secondary | ICD-10-CM | POA: Diagnosis not present

## 2016-01-02 DIAGNOSIS — C20 Malignant neoplasm of rectum: Secondary | ICD-10-CM

## 2016-01-02 MED ORDER — ONDANSETRON HCL 4 MG PO TABS
8.0000 mg | ORAL_TABLET | Freq: Once | ORAL | Status: AC
  Administered 2016-01-02: 8 mg via ORAL
  Filled 2016-01-02: qty 2

## 2016-01-02 MED ORDER — ONDANSETRON HCL 4 MG PO TABS
8.0000 mg | ORAL_TABLET | Freq: Once | ORAL | Status: DC
  Filled 2016-01-02: qty 2

## 2016-01-02 MED ORDER — ONDANSETRON HCL 8 MG PO TABS: ORAL_TABLET | ORAL | Status: AC

## 2016-01-02 NOTE — Progress Notes (Signed)
Verbal order to give 8mg  oral zofran x 1, , gave 2 tabs of 4mg  zofran to toal 8mg , patien took orally  With sips of water, tolerated well, no coughing, wife and sittle leslie in room 2 with pastint,warm blanket offered 12:14 PM

## 2016-01-02 NOTE — Telephone Encounter (Signed)
I spoke with the patient's wife to communicate the change in dosing of his Zofran to one tab po 30 minutes prior to radiotherapy and every 8 hours as needed for nausea/vomiting.

## 2016-01-04 NOTE — Progress Notes (Signed)
  Radiation Oncology         [phone] ________________________________  Name: Trevor Lutz MRN: [id number]  Date: 01/02/2016  DOB: 07-10-1921  SIMULATION AND TREATMENT PLANNING NOTE  DIAGNOSIS:     ICD-9-CM ICD-10-CM   1. Rectal cancer (HCC) 154.1 C20 ondansetron (ZOFRAN) tablet 8 mg     Site:  Rectosigmoid  NARRATIVE:  The patient was brought to the [facility].  Identity was confirmed.  All relevant records and images related to the planned course of therapy were reviewed.   Written consent to proceed with treatment was confirmed which was freely given after reviewing the details related to the planned course of therapy had been reviewed with the patient.  Then, the patient was set-up in a stable reproducible  supine position for radiation therapy.  CT images were obtained.  Surface markings were placed.    Medically necessary complex treatment device(s) for immobilization:  Customized vac lock bag.   The CT images were loaded into the planning software.  Then the target and avoidance structures were contoured.  Treatment planning then occurred.  The radiation prescription was entered and confirmed.  A total of 1 complex treatment devices were fabricated which relate to the designed radiation treatment fields. Each of these customized fields/ complex treatment devices will be used on a daily basis during the radiation course. I have requested : 3D Simulation  I have requested a DVH of the following structures: Target volume, bladder, left hip, right hip.   PLAN:  The patient will receive 25 Gy in 5 fractions.  No chemotherapy will be given during treatment  ________________________________   Jodelle Gross, MD, PhD

## 2016-01-05 ENCOUNTER — Ambulatory Visit
Admission: RE | Admit: 2016-01-05 | Discharge: 2016-01-05 | Disposition: A | Discharge status: Home / Self Care | Payer: Medicare PPO | Source: Ambulatory Visit | Attending: Radiation Oncology | Admitting: Radiation Oncology

## 2016-01-05 ENCOUNTER — Ambulatory Visit (INDEPENDENT_AMBULATORY_CARE_PROVIDER_SITE_OTHER): Payer: Medicare PPO | Admitting: *Deleted

## 2016-01-05 DIAGNOSIS — Z510 Encounter for antineoplastic radiation therapy: Secondary | ICD-10-CM | POA: Diagnosis not present

## 2016-01-05 DIAGNOSIS — I639 Cerebral infarction, unspecified: Secondary | ICD-10-CM

## 2016-01-06 ENCOUNTER — Telehealth: Payer: Self-pay | Admitting: *Deleted

## 2016-01-06 ENCOUNTER — Ambulatory Visit
Admission: RE | Admit: 2016-01-06 | Discharge: 2016-01-06 | Disposition: A | Discharge status: Home / Self Care | Payer: Medicare PPO | Source: Ambulatory Visit | Attending: Radiation Oncology | Admitting: Radiation Oncology

## 2016-01-06 ENCOUNTER — Encounter: Payer: Self-pay | Admitting: Radiation Oncology

## 2016-01-06 DIAGNOSIS — Z510 Encounter for antineoplastic radiation therapy: Secondary | ICD-10-CM | POA: Diagnosis not present

## 2016-01-06 NOTE — Telephone Encounter (Signed)
Oncology Nurse Navigator Documentation  Oncology Nurse Navigator Flowsheets 01/06/2016  Navigator Location [facility]  Navigator Encounter Type Telephone  Telephone Incoming Call;Outgoing Call;Appt Confirmation/Clarification  Abnormal Finding Date -  Confirmed Diagnosis Date -  Treatment Initiated Date 01/02/2016  Patient Visit Type -  Treatment Phase Active Tx--RT   Barriers/Navigation Needs Coordination of Care--called to cancel 7/26 and 7/27 (he needs to rest) and request to reschedule for 7/28 and 7/31  Education -  Interventions Coordination of Care--spoke with RT tech and appointments made  Coordination of Care Appts-7/27 at 11 am and 7/31 at 12:10  Education Method -  Acuity -  Time Spent with Patient 15  Left the new appointments on Kristi Luther's voice mail (assistant for Mrs. Istre).

## 2016-01-06 NOTE — Progress Notes (Signed)
Carelink Summary Report / Loop Recorder 

## 2016-01-07 ENCOUNTER — Telehealth: Payer: Self-pay | Admitting: *Deleted

## 2016-01-07 ENCOUNTER — Encounter: Payer: Self-pay | Admitting: Genetic Counselor

## 2016-01-07 ENCOUNTER — Ambulatory Visit: Payer: Medicare PPO

## 2016-01-07 NOTE — Telephone Encounter (Signed)
  Oncology Nurse Navigator Documentation  Navigator Location: [facility] (01/07/16 [id number]) Navigator Encounter Type: Telephone (01/07/16 0857) Telephone: Outgoing Call;Patient Update (01/07/16 0857)   Left VM for wife that tumor board discussion findings: cancer appears according to all the stains to be done it appears to be squamous cell cancer, which is rare but not impossible. Also was confirmed that if develops obstructive symptoms his colorectal area could be stented by Dr. Watt Climes.

## 2016-01-08 ENCOUNTER — Telehealth: Payer: Self-pay | Admitting: *Deleted

## 2016-01-08 ENCOUNTER — Ambulatory Visit: Payer: Medicare PPO

## 2016-01-08 NOTE — Telephone Encounter (Signed)
Call from wife's assistant, Kassie Mends: Wife has requested to cancel the RT for 7/28 and 7/31 as well as the visit with Dr. Tammi Klippel. She will call Dr. Lisbeth Renshaw on Monday to discuss his case. Notified Thayer Headings, RN with Dr. Lisbeth Renshaw via VM of changes.

## 2016-01-09 ENCOUNTER — Ambulatory Visit: Payer: Medicare PPO

## 2016-01-09 ENCOUNTER — Ambulatory Visit: Admission: RE | Admit: 2016-01-09 | Payer: Medicare PPO | Source: Ambulatory Visit | Admitting: Radiation Oncology

## 2016-01-12 ENCOUNTER — Ambulatory Visit: Payer: Medicare PPO

## 2016-01-13 ENCOUNTER — Ambulatory Visit: Payer: Medicare PPO

## 2016-01-14 ENCOUNTER — Ambulatory Visit: Payer: Medicare PPO

## 2016-01-15 ENCOUNTER — Ambulatory Visit: Payer: Medicare PPO

## 2016-01-15 ENCOUNTER — Ambulatory Visit: Admission: RE | Admit: 2016-01-15 | Payer: Medicare PPO | Source: Ambulatory Visit

## 2016-01-16 ENCOUNTER — Ambulatory Visit: Payer: Medicare PPO

## 2016-01-19 ENCOUNTER — Ambulatory Visit: Payer: Medicare PPO

## 2016-01-22 ENCOUNTER — Encounter: Payer: Medicare PPO | Admitting: Nurse Practitioner

## 2016-01-26 NOTE — Progress Notes (Signed)
°  Radiation Oncology         [phone] ________________________________  Name: Trevor Lutz MRN: [id number]  Date: 01/06/2016  DOB: [birth year]  End of Treatment Note  Diagnosis: Rectal cancer [facility])   Staging form: Colon and Rectum, AJCC 7th Edition   - Clinical: Stage Unknown ([place], NX, M0) - Signed by Ladell Pier, MD on 01/01/2016     Indication for treatment: Palliative  Radiation treatment dates: 01/02/16, 01/05/16, 01/06/16  Site/dose: 15 Gy out of 25 Gy in 3 out of 5 fractions to the pelvis.  Beams/energy: 3D // 6X Photon  Narrative: The patient's family decided to discontinue treatment.  Plan: The patient will follow up on an as needed basis.  ------------------------------------------------  Jodelle Gross, MD, PhD  This document serves as a record of services personally performed by Kyung Rudd, MD. It was created on his behalf by Darcus Austin, a trained medical scribe. The creation of this record is based on the scribe's personal observations and the provider's statements to them. This document has been checked and approved by the attending provider.

## 2016-01-31 LAB — CUP PACEART REMOTE DEVICE CHECK: MDC IDC SESS DTM: 20170724210701

## 2016-01-31 NOTE — Progress Notes (Signed)
Carelink summary report received. Battery status OK. Normal device function. No new symptom episodes, tachy episodes, brady, or pause episodes. No new AF episodes. Monthly summary reports and ROV/PRN 

## 2016-02-04 ENCOUNTER — Ambulatory Visit (INDEPENDENT_AMBULATORY_CARE_PROVIDER_SITE_OTHER): Payer: Medicare PPO | Admitting: *Deleted

## 2016-02-04 DIAGNOSIS — I639 Cerebral infarction, unspecified: Secondary | ICD-10-CM | POA: Diagnosis not present

## 2016-02-05 NOTE — Progress Notes (Signed)
Carelink Summary Report / Loop Recorder 

## 2016-03-03 LAB — CUP PACEART REMOTE DEVICE CHECK: Date Time Interrogation Session: 20170823211140

## 2016-03-03 NOTE — Progress Notes (Signed)
Carelink summary report received. Battery status OK. Normal device function. No new symptom episodes, tachy episodes, brady, or pause episodes. No new AF episodes. Monthly summary reports and ROV/PRN 

## 2016-03-05 ENCOUNTER — Ambulatory Visit (INDEPENDENT_AMBULATORY_CARE_PROVIDER_SITE_OTHER): Payer: Medicare PPO | Admitting: *Deleted

## 2016-03-05 DIAGNOSIS — I639 Cerebral infarction, unspecified: Secondary | ICD-10-CM

## 2016-03-08 NOTE — Progress Notes (Signed)
Carelink Summary Report / Loop Recorder 

## 2016-03-25 ENCOUNTER — Ambulatory Visit: Payer: Self-pay | Admitting: Adult Health

## 2016-04-02 IMAGING — CR DG HIP (WITH OR WITHOUT PELVIS) 2-3V*L*
3 series · 3 of 3 positions shown · non-contrast
Comparison: None.

CLINICAL DATA: Left buttock and hip area pain. Fall 5 days ago.
Initial encounter.

EXAM:
DG HIP (WITH OR WITHOUT PELVIS) 2-3V LEFT

[t pelvis a.p.]
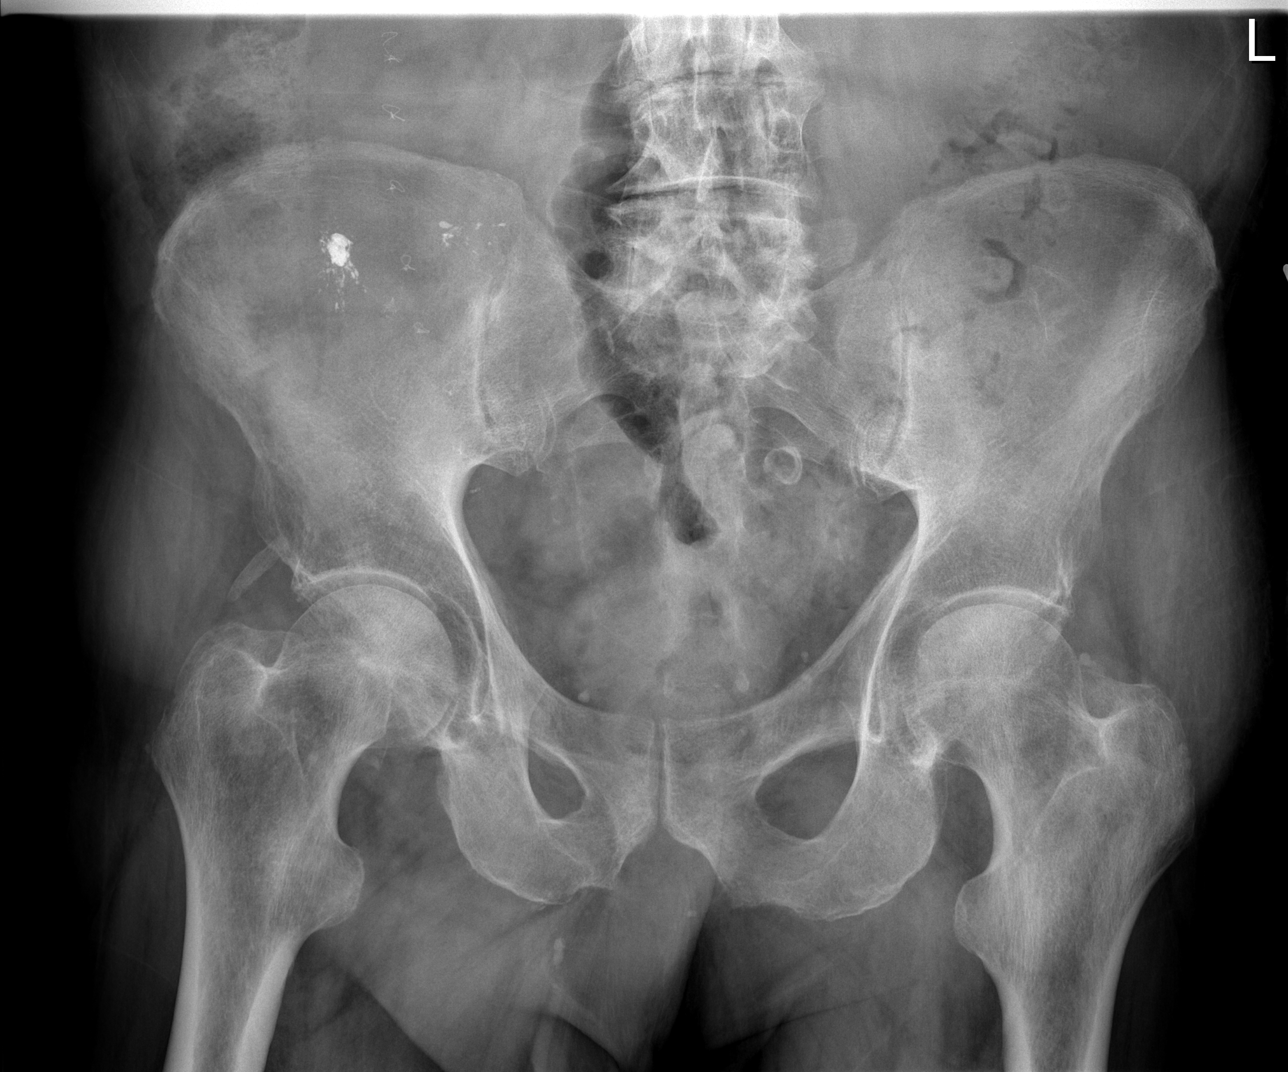

[t hip ap left]
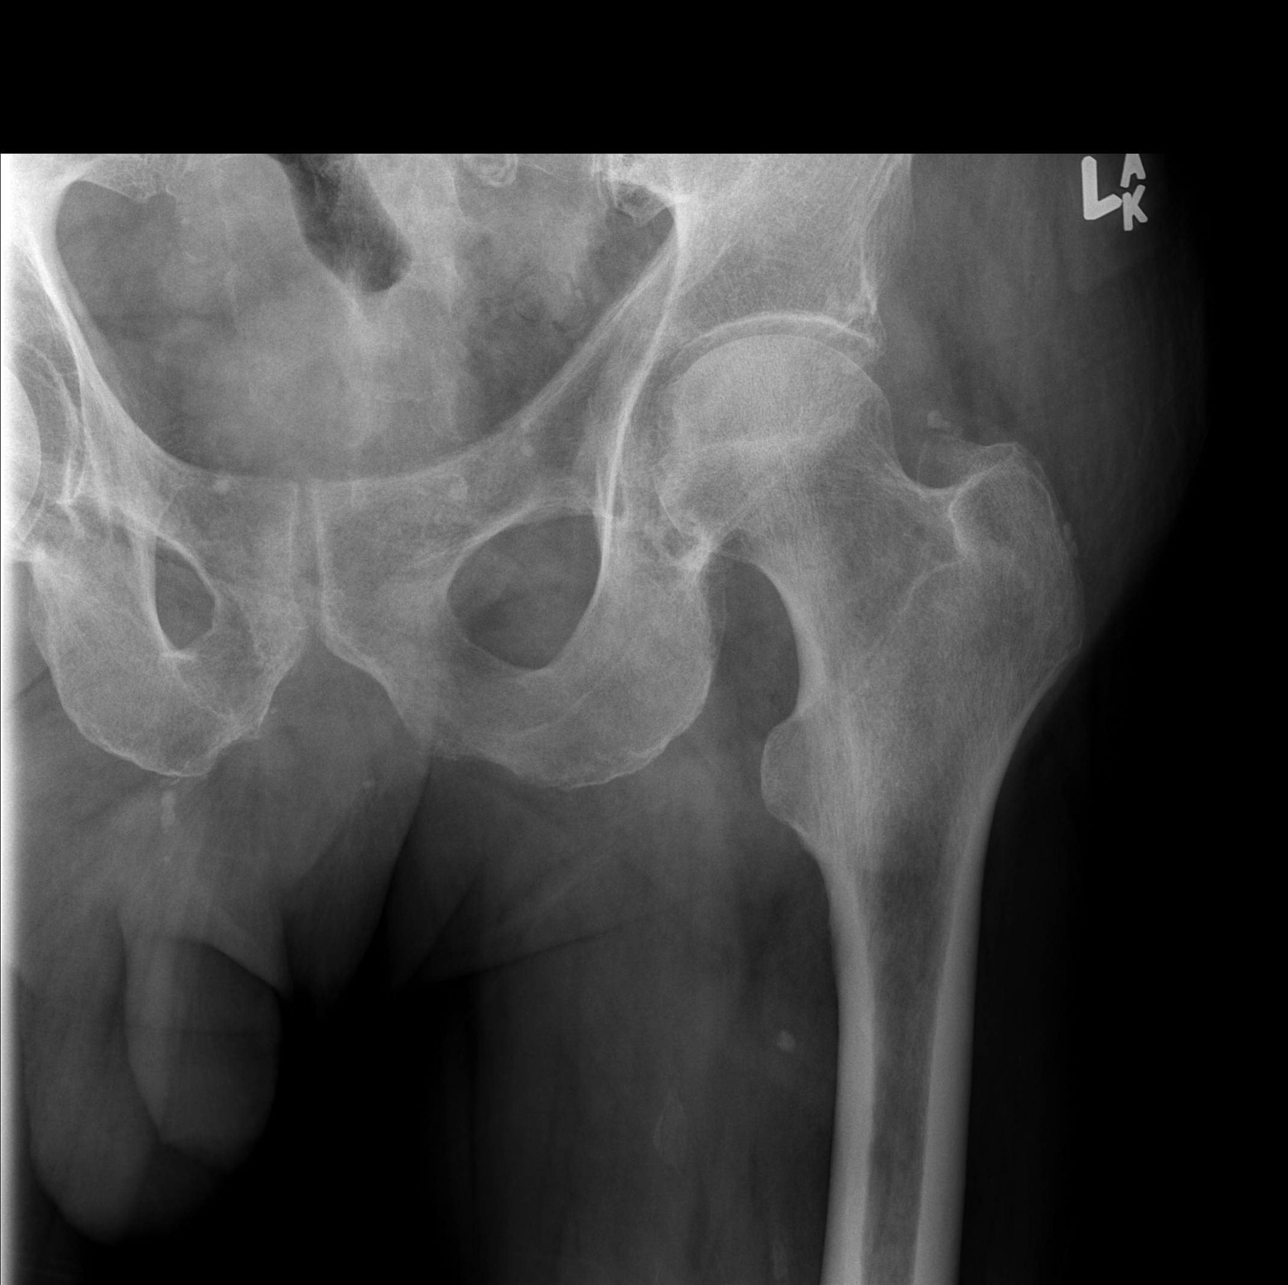

[t hip frog leg left]
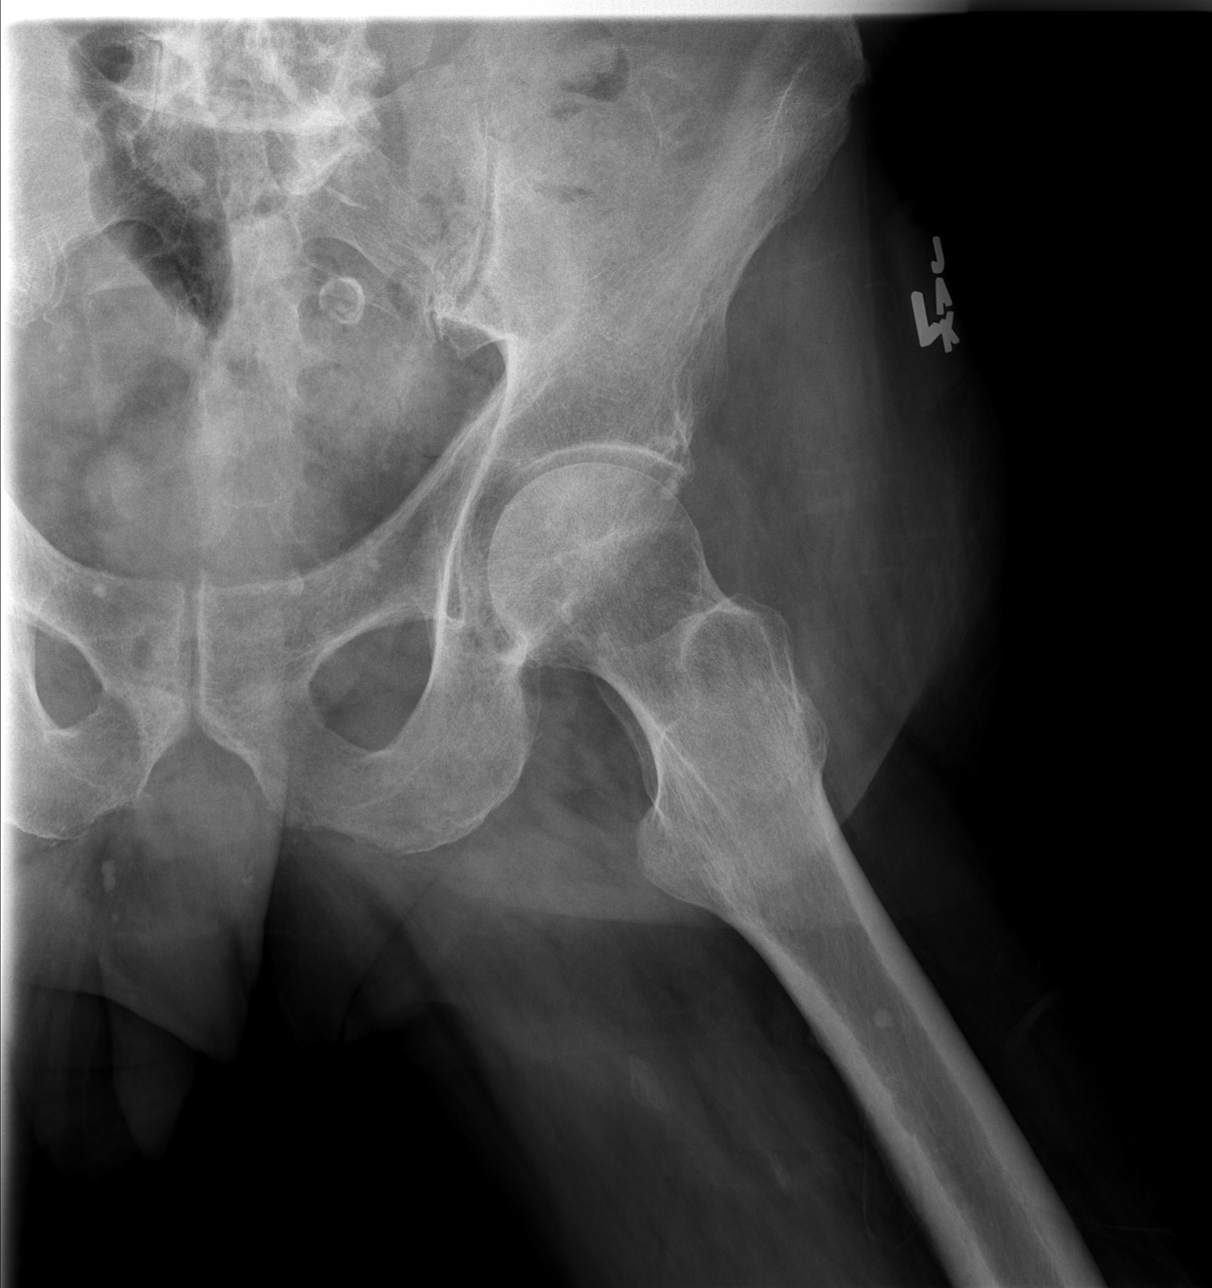

[3 of 3 positions shown; findings below may reference images not displayed]

FINDINGS: No acute fracture is identified. The left hip is located. Hip joint
space widths are relatively well preserved. Curvilinear
calcification is noted in the region of the anterior inferior iliac
spine on the right. Postsurgical changes are noted in the right
lower quadrant of the abdomen. Small calcifications in the pelvis
may represent phleboliths.
IMPRESSION: No evidence of acute osseous abnormality.

## 2016-04-04 LAB — CUP PACEART REMOTE DEVICE CHECK: Date Time Interrogation Session: 20170922213848

## 2016-04-04 NOTE — Progress Notes (Signed)
Carelink summary report received. Battery status OK. Normal device function. No new symptom episodes, tachy episodes, brady, or pause episodes. No new AF episodes. Monthly summary reports and ROV with SK PRN. 

## 2016-04-05 ENCOUNTER — Ambulatory Visit (INDEPENDENT_AMBULATORY_CARE_PROVIDER_SITE_OTHER): Payer: Medicare PPO | Admitting: *Deleted

## 2016-04-05 DIAGNOSIS — I639 Cerebral infarction, unspecified: Secondary | ICD-10-CM

## 2016-04-05 NOTE — Progress Notes (Signed)
Carelink Summary Report / Loop Recorder 

## 2016-05-01 LAB — CUP PACEART REMOTE DEVICE CHECK
Date Time Interrogation Session: 20171022213631
MDC IDC PG IMPLANT DT: 20150513

## 2016-05-01 NOTE — Progress Notes (Signed)
Carelink summary report received. Battery status OK. Normal device function. No new symptom episodes, tachy episodes, brady, or pause episodes. No new AF episodes. Monthly summary reports and ROV/PRN 

## 2016-05-04 ENCOUNTER — Ambulatory Visit (INDEPENDENT_AMBULATORY_CARE_PROVIDER_SITE_OTHER): Payer: Medicare PPO | Admitting: *Deleted

## 2016-05-04 DIAGNOSIS — I639 Cerebral infarction, unspecified: Secondary | ICD-10-CM | POA: Diagnosis not present

## 2016-05-05 NOTE — Progress Notes (Signed)
Carelink Summary Report / Loop Recorder 

## 2016-05-27 ENCOUNTER — Telehealth: Payer: Self-pay | Admitting: Cardiology

## 2016-05-27 NOTE — Telephone Encounter (Signed)
LMOVM requesting that pt send manual transmission b/c home monitor has not updated in at least 14 days.    

## 2016-05-28 ENCOUNTER — Telehealth: Payer: Self-pay

## 2016-05-28 NOTE — Telephone Encounter (Signed)
Informed pts wife that pt needed to send in a manual transmission, pt wife stated that she would get his caregiver to send it in today.

## 2016-06-03 ENCOUNTER — Ambulatory Visit (INDEPENDENT_AMBULATORY_CARE_PROVIDER_SITE_OTHER): Payer: Medicare PPO | Admitting: *Deleted

## 2016-06-03 DIAGNOSIS — I639 Cerebral infarction, unspecified: Secondary | ICD-10-CM

## 2016-06-04 NOTE — Progress Notes (Signed)
Carelink Summary Report / Loop Recorder 

## 2016-06-17 LAB — CUP PACEART REMOTE DEVICE CHECK
Date Time Interrogation Session: 20171121223735
Implantable Pulse Generator Implant Date: 20150513

## 2016-06-17 NOTE — Progress Notes (Signed)
Carelink summary report received. Battery status OK. Normal device function. No new symptom episodes, tachy episodes, brady, or pause episodes. No new AF episodes. Monthly summary reports and ROV/PRN 

## 2016-07-05 ENCOUNTER — Ambulatory Visit (INDEPENDENT_AMBULATORY_CARE_PROVIDER_SITE_OTHER): Payer: Medicare PPO | Admitting: *Deleted

## 2016-07-05 DIAGNOSIS — I639 Cerebral infarction, unspecified: Secondary | ICD-10-CM | POA: Diagnosis not present

## 2016-07-05 NOTE — Progress Notes (Signed)
Carelink Summary Report / Loop Recorder 

## 2016-07-15 DEATH — deceased

## 2016-07-21 LAB — CUP PACEART REMOTE DEVICE CHECK
Date Time Interrogation Session: 20171221230854
Date Time Interrogation Session: 20180120233739
Implantable Pulse Generator Implant Date: 20150513
MDC IDC PG IMPLANT DT: 20150513
